# Patient Record
Sex: Female | Born: 1970 | Race: Black or African American | Hispanic: No | Marital: Single | Smoking: Never smoker
Health system: Southern US, Community
[De-identification: ages and names within clinical notes are randomized; demographics above are authoritative.]

## PROBLEM LIST (undated history)

## (undated) DIAGNOSIS — I10 Essential (primary) hypertension: Secondary | ICD-10-CM

## (undated) DIAGNOSIS — K611 Rectal abscess: Secondary | ICD-10-CM

## (undated) DIAGNOSIS — F32A Depression, unspecified: Secondary | ICD-10-CM

## (undated) DIAGNOSIS — D509 Iron deficiency anemia, unspecified: Secondary | ICD-10-CM

## (undated) DIAGNOSIS — N390 Urinary tract infection, site not specified: Secondary | ICD-10-CM

## (undated) HISTORY — DX: Rectal abscess: K61.1

## (undated) HISTORY — PX: INCISE AND DRAIN ABCESS: PRO64

## (undated) HISTORY — DX: Urinary tract infection, site not specified: N39.0

## (undated) HISTORY — DX: Depression, unspecified: F32.A

## (undated) HISTORY — DX: Essential (primary) hypertension: I10

## (undated) HISTORY — DX: Iron deficiency anemia, unspecified: D50.9

## (undated) HISTORY — PX: GASTRIC BYPASS: SHX52

---

## 1998-03-29 ENCOUNTER — Other Ambulatory Visit: Admission: RE | Admit: 1998-03-29 | Discharge: 1998-03-29 | Payer: Self-pay | Admitting: Obstetrics and Gynecology

## 1999-02-18 ENCOUNTER — Ambulatory Visit (HOSPITAL_COMMUNITY): Admission: RE | Admit: 1999-02-18 | Discharge: 1999-02-18 | Payer: Self-pay | Admitting: Gastroenterology

## 1999-02-18 ENCOUNTER — Encounter: Payer: Self-pay | Admitting: Gastroenterology

## 1999-08-11 ENCOUNTER — Encounter: Payer: Self-pay | Admitting: General Surgery

## 1999-08-12 ENCOUNTER — Ambulatory Visit (HOSPITAL_COMMUNITY): Admission: RE | Admit: 1999-08-12 | Discharge: 1999-08-13 | Payer: Self-pay | Admitting: General Surgery

## 1999-08-12 ENCOUNTER — Encounter: Payer: Self-pay | Admitting: General Surgery

## 2000-06-04 ENCOUNTER — Other Ambulatory Visit: Admission: RE | Admit: 2000-06-04 | Discharge: 2000-06-04 | Payer: Self-pay | Admitting: Internal Medicine

## 2000-06-04 ENCOUNTER — Encounter (INDEPENDENT_AMBULATORY_CARE_PROVIDER_SITE_OTHER): Payer: Self-pay

## 2000-08-20 ENCOUNTER — Other Ambulatory Visit: Admission: RE | Admit: 2000-08-20 | Discharge: 2000-08-20 | Payer: Self-pay | Admitting: Gynecology

## 2000-08-20 ENCOUNTER — Encounter (INDEPENDENT_AMBULATORY_CARE_PROVIDER_SITE_OTHER): Payer: Self-pay

## 2004-02-08 ENCOUNTER — Encounter: Admission: RE | Admit: 2004-02-08 | Discharge: 2004-02-08 | Payer: Self-pay | Admitting: Internal Medicine

## 2004-05-29 ENCOUNTER — Ambulatory Visit (HOSPITAL_BASED_OUTPATIENT_CLINIC_OR_DEPARTMENT_OTHER): Admission: RE | Admit: 2004-05-29 | Discharge: 2004-05-29 | Payer: Self-pay | Admitting: Internal Medicine

## 2004-06-01 ENCOUNTER — Ambulatory Visit (HOSPITAL_COMMUNITY): Admission: RE | Admit: 2004-06-01 | Discharge: 2004-06-01 | Payer: Self-pay | Admitting: Internal Medicine

## 2005-05-02 ENCOUNTER — Other Ambulatory Visit: Admission: RE | Admit: 2005-05-02 | Discharge: 2005-05-02 | Payer: Self-pay | Admitting: Obstetrics and Gynecology

## 2006-12-11 HISTORY — PX: CYSTECTOMY: SUR359

## 2007-01-03 ENCOUNTER — Encounter: Admission: RE | Admit: 2007-01-03 | Discharge: 2007-01-03 | Payer: Self-pay | Admitting: General Surgery

## 2007-02-06 ENCOUNTER — Other Ambulatory Visit: Admission: RE | Admit: 2007-02-06 | Discharge: 2007-02-06 | Payer: Self-pay | Admitting: Gynecology

## 2007-10-02 IMAGING — CT CT CHEST W/ CM
4 of 5 series · 13 of 46 positions shown, 19 images · IV contrast (READICAT/WATER & [ID] OMNI 300)
Comparison: Outside plain films.

CLINICAL DATA: Metallic density seen in the lower chest/upper abdomen on prior
outside plain films. Question foreign body within the patient for artifact
external to the patient.

CHEST CT WITH CONTRAST
TECHNIQUE: Multidetector CT imaging of the chest was performed following the
standard protocol during bolus administration of intravenous contrast.
Contrast:  125 cc Omnipaque 300
TECHNIQUE: Multidetector CT imaging of the abdomen was performed following the

[Series 3: chest/abd/pelvis · axial · 0.88mm/px · z∈[-385,-115]mm · 6 of 88 slices shown]
[im 6/88  soft-tissue]
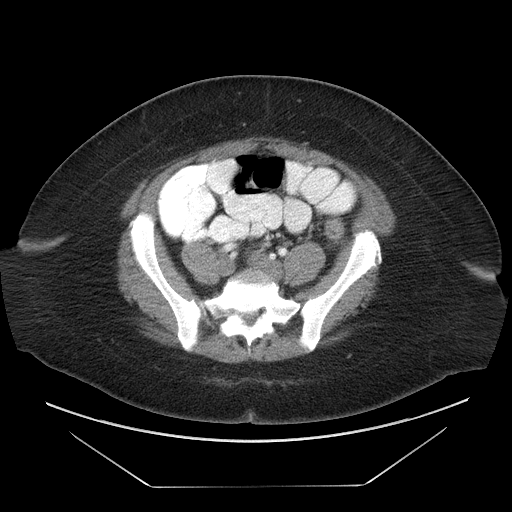
[im 17/88  soft-tissue]
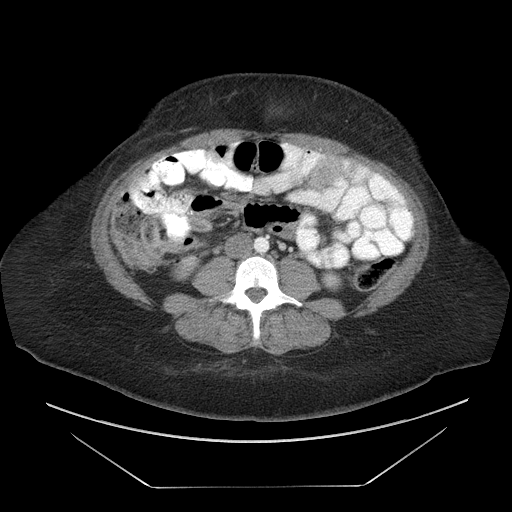
[im 28/88  soft-tissue]
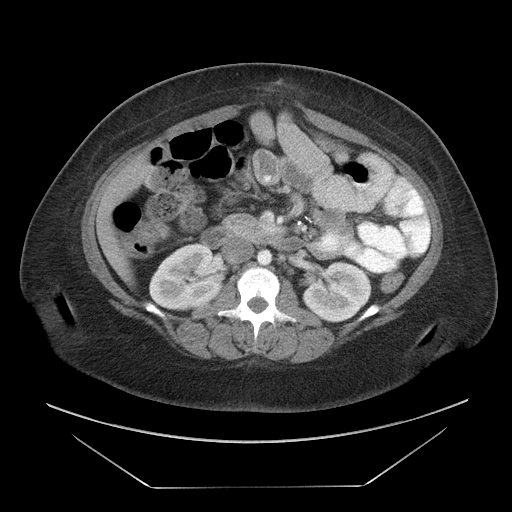
[im 39/88  soft-tissue]
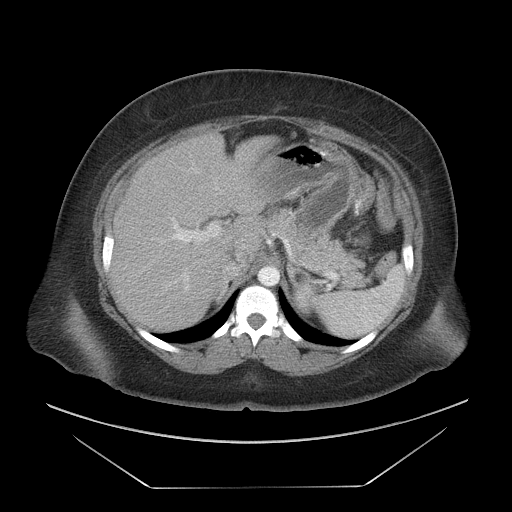
[im 49/88  soft-tissue]
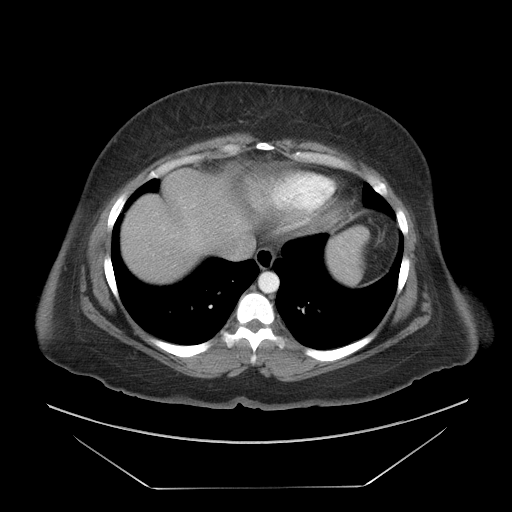
[im 60/88  soft-tissue]
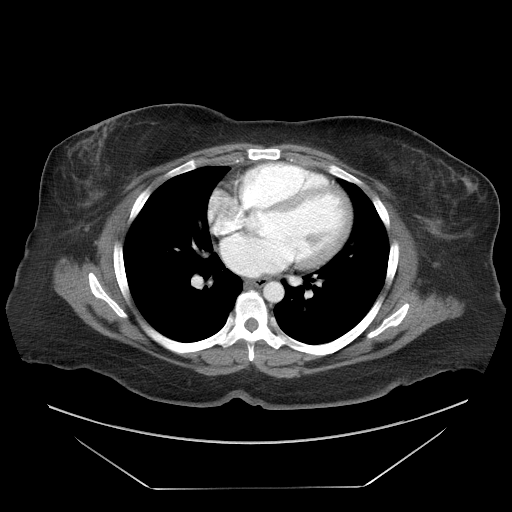

[Series 5: renal delay · axial · delayed · 0.86mm/px · z∈[-319,-249]mm · 3 of 30 slices shown, 7 images]
[im 8/30  soft-tissue]
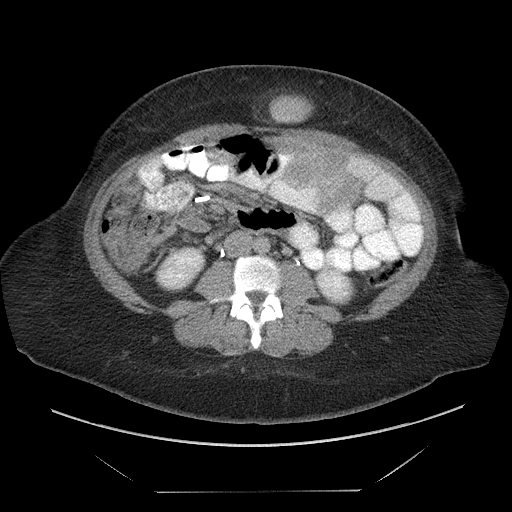
[im 8/30  lung]
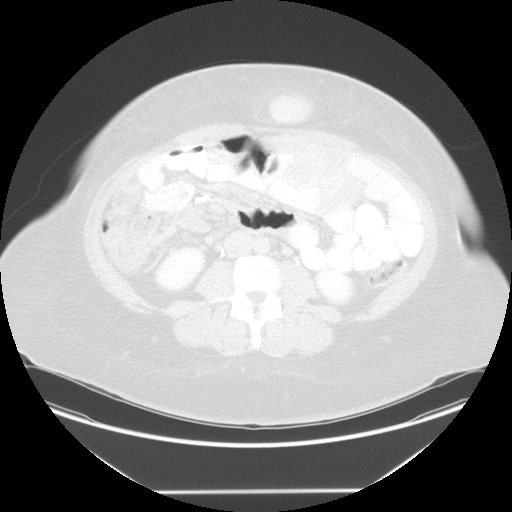
[im 8/30  bone]
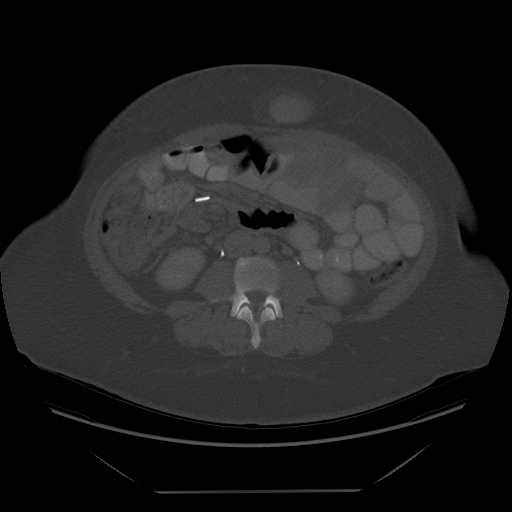
[im 15/30  soft-tissue]
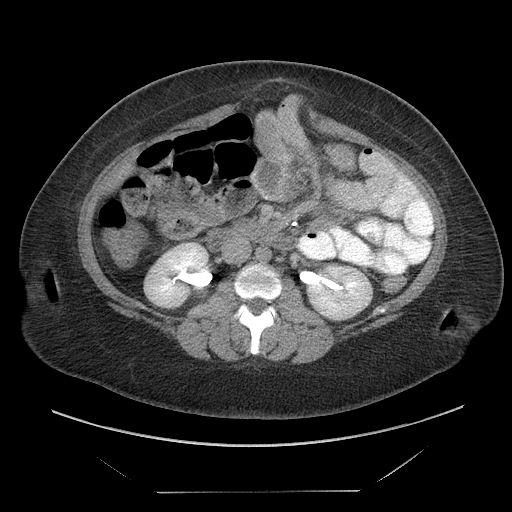
[im 15/30  lung]
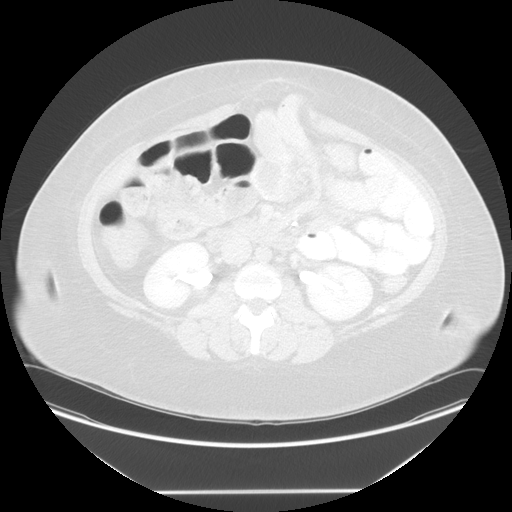
[im 22/30  soft-tissue]
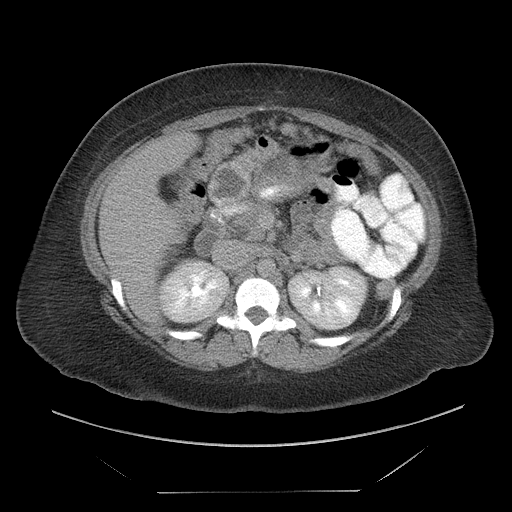
[im 22/30  lung]
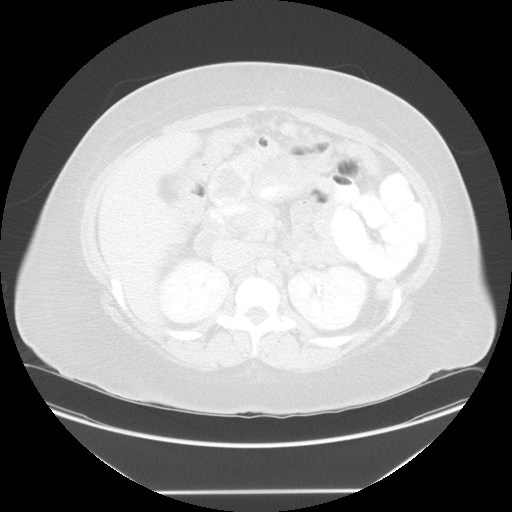

[Series 601: coronal body · coronal · 0.88mm/px · 1 of 134 slices shown, 2 images]
[im 45/134  soft-tissue]
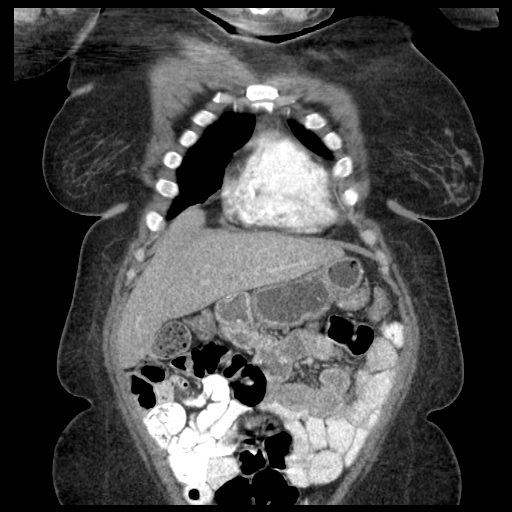
[im 45/134  bone]
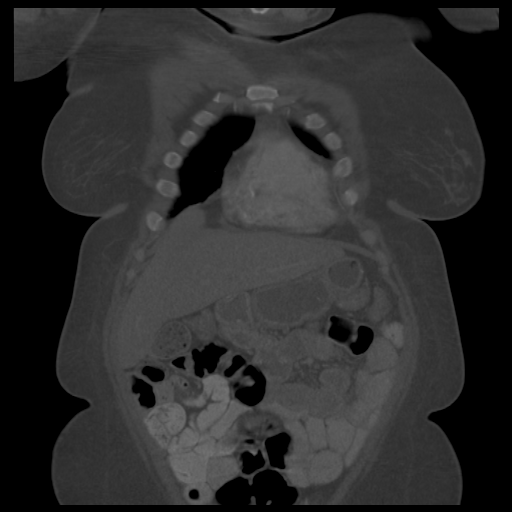

[Series 602: sagittal body · sagittal · 0.88mm/px · 3 of 181 slices shown, 4 images]
[im 61/181  soft-tissue]
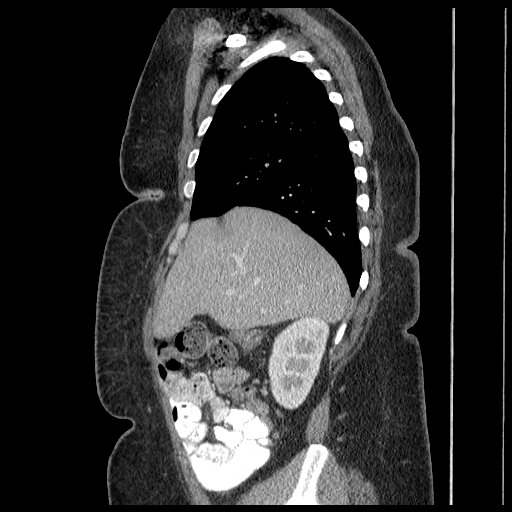
[im 81/181  soft-tissue]
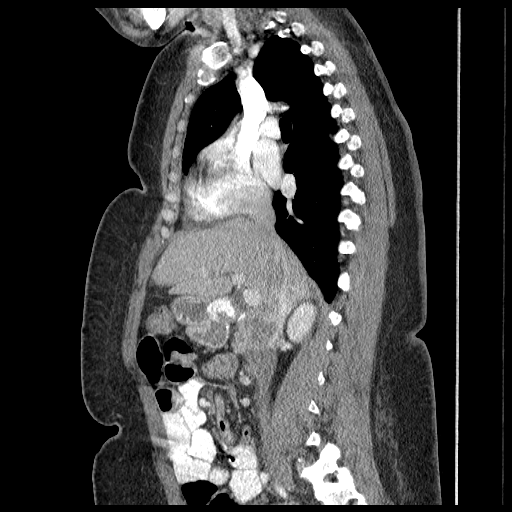
[im 81/181  bone]
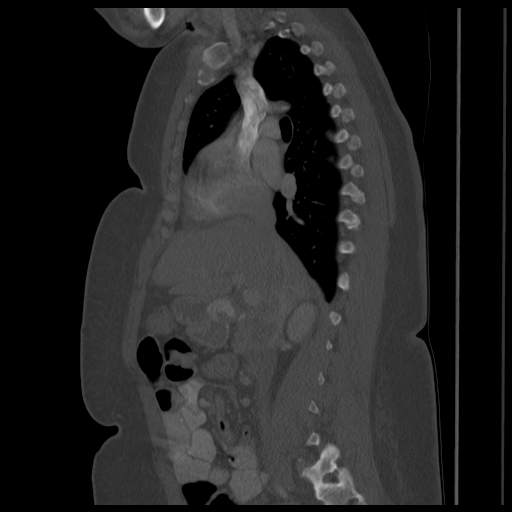
[im 101/181  soft-tissue]
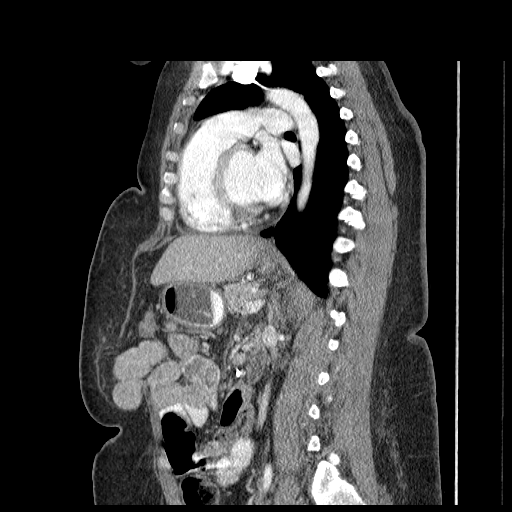

[13 of 46 positions shown; findings below may reference images not displayed]

FINDINGS: Lungs are clear. No pleural or pericardial effusion. No mediastinal,
hilar, or axillary adenopathy. Heart is normal size.

IMPRESSION

No acute findings in the chest.

ABDOMEN CT WITH CONTRAST
FINDINGS: Patient is status post cholecystectomy. There is a radiopaque linear
density noted anterior to the left lobe of the liver near the dome, which
appears to represent a metallic clip. Other linear metallic densities are noted
lower within the abdomen, in the mesentery. These may have been either placed in
these locations during prior surgical procedure or have migrated to these
locations. There is no surrounding inflammatory change or other complicating
feature noted with these presumed clips.

Liver, spleen, pancreas, adrenals, kidneys unremarkable. There is a ventral
hernia present which contains a knuckle of small bowel. No evidence of bowel
obstruction, free fluid, free air, or adenopathy.

IMPRESSION

The density seen on outside plain films appears to represent a metallic clip
anterior to the left lobe of the liver. There are other metallic clips lower in
the abdomen within the mesentery. Surgical clips are also seen from prior
cholecystectomy. No complicating features seen with these clips.

Ventral hernia containing small bowel, without obstruction.

## 2009-06-27 ENCOUNTER — Emergency Department (HOSPITAL_BASED_OUTPATIENT_CLINIC_OR_DEPARTMENT_OTHER): Admission: EM | Admit: 2009-06-27 | Discharge: 2009-06-27 | Payer: Self-pay | Admitting: Emergency Medicine

## 2009-11-29 LAB — CONVERTED CEMR LAB: Pap Smear: NORMAL

## 2010-09-20 ENCOUNTER — Ambulatory Visit: Payer: Self-pay | Admitting: Family Medicine

## 2010-09-20 DIAGNOSIS — T148XXA Other injury of unspecified body region, initial encounter: Secondary | ICD-10-CM | POA: Insufficient documentation

## 2010-09-20 DIAGNOSIS — Z9884 Bariatric surgery status: Secondary | ICD-10-CM

## 2010-09-21 ENCOUNTER — Telehealth (INDEPENDENT_AMBULATORY_CARE_PROVIDER_SITE_OTHER): Payer: Self-pay | Admitting: *Deleted

## 2010-09-21 DIAGNOSIS — E559 Vitamin D deficiency, unspecified: Secondary | ICD-10-CM | POA: Insufficient documentation

## 2010-09-21 LAB — CONVERTED CEMR LAB
ALT: 17 units/L (ref 0–35)
AST: 17 units/L (ref 0–37)
Albumin: 3.4 g/dL — ABNORMAL LOW (ref 3.5–5.2)
BUN: 11 mg/dL (ref 6–23)
Basophils Relative: 0.4 % (ref 0.0–3.0)
CO2: 25 meq/L (ref 19–32)
Calcium: 8.8 mg/dL (ref 8.4–10.5)
Chloride: 108 meq/L (ref 96–112)
Creatinine, Ser: 0.9 mg/dL (ref 0.4–1.2)
Eosinophils Relative: 0.9 % (ref 0.0–5.0)
Ferritin: 5.5 ng/mL — ABNORMAL LOW (ref 10.0–291.0)
Folate: 18.9 ng/mL
GFR calc non Af Amer: 95.52 mL/min (ref >60)
Glucose, Bld: 83 mg/dL (ref 70–99)
HDL: 48.1 mg/dL (ref >39.00)
Hemoglobin: 11.7 g/dL — ABNORMAL LOW (ref 12.0–15.0)
INR: 1.1 — ABNORMAL HIGH (ref 0.8–1.0)
MCHC: 33.1 g/dL (ref 30.0–36.0)
Potassium: 4.3 meq/L (ref 3.5–5.1)
Total Bilirubin: 0.4 mg/dL (ref 0.3–1.2)
Total Protein: 6.9 g/dL (ref 6.0–8.3)
Triglycerides: 49 mg/dL (ref 0.0–149.0)
Vit D, 25-Hydroxy: 10 ng/mL — ABNORMAL LOW (ref 30–89)
Vitamin B-12: 380 pg/mL (ref 211–911)
WBC: 4.9 10*3/uL (ref 4.5–10.5)
aPTT: 31.3 s — ABNORMAL HIGH (ref 21.7–28.8)

## 2011-01-01 ENCOUNTER — Encounter: Payer: Self-pay | Admitting: Internal Medicine

## 2011-01-12 NOTE — Letter (Signed)
Summary: Metabolic Syndrome Program Form/[employer]  Metabolic Syndrome Program Form/[employer]   Imported By: Lanelle Bal 09/28/2010 15:44:54  _____________________________________________________________________  External Attachment:    Type:   Image     Comment:   External Document

## 2011-01-12 NOTE — Progress Notes (Signed)
Summary: Lab result  Phone Note Outgoing Call   Call placed by: [facility] CMA,  September 21, 2010 10:40 AM Details for Reason: --Pt is anemic---but only slightly----try just taking MVI with iron two times a day.   B12 is normal but low normal----take sublingual b12 daily --very low vita D-----take rx for 50000u weekly #4  2 refills and otc vita D3  2000u daily----recheck 3 months Summary of Call: left message to call office................Marland KitchenFelecia Deloach CMA  September 21, 2010 10:03 AM  Discuss with patient will pick-up copy of labs and rx, placed up front for pt............Marland KitchenFelecia Deloach CMA  September 21, 2010 10:40 AM     New/Updated Medications: VITAMIN D (ERGOCALCIFEROL) 50000 UNIT CAPS (ERGOCALCIFEROL) Take 1 tab weekly Prescriptions: VITAMIN D (ERGOCALCIFEROL) 50000 UNIT CAPS (ERGOCALCIFEROL) Take 1 tab weekly  #4 x 2   Entered by:   Jeremy Johann CMA   Authorized by:   Loreen Freud DO   Signed by:   Jeremy Johann CMA on 09/21/2010   Method used:   Print then Give to Patient   RxID:   [id number]

## 2011-01-12 NOTE — Letter (Signed)
Summary: Cancer Screening/[employer] Personalized Risk Profile  Cancer Screening/[employer] Personalized Risk Profile   Imported By: Lanelle Bal 09/28/2010 15:59:23  _____________________________________________________________________  External Attachment:    Type:   Image     Comment:   External Document

## 2011-01-12 NOTE — Assessment & Plan Note (Signed)
Summary: CPX--NO PAP///SPH   Vital Signs:  Patient profile:   40 year old female Height:      60.5 inches Weight:      226 pounds BMI:     43.57 Pulse rate:   86 / minute BP sitting:   132 / 78  (left arm)  Vitals Entered By: Doristine Devoid CMA (September 20, 2010 12:57 PM) CC: NEW EST- bruising xmonths and cramps in hands hx of vit d defiency   History of Present Illness: Pt here for cpe.  Pt has gyn.  Pt c/o hands cramping and bruising.  pt is a gastric bypass pt at [facility] but her surgeon is no longer practicing.      Preventive Screening-Counseling & Management  Alcohol-Tobacco     Alcohol drinks/day: 0     Smoking Status: never  Caffeine-Diet-Exercise     Caffeine use/day: 0     Diet Comments: gastric bypass pt     Does Patient Exercise: yes     Type of exercise: gym      Exercise (avg: min/session): 30-60     Times/week: <3     Exercise Counseling: to improve exercise regimen  Hep-HIV-STD-Contraception     Dental Visit-last 6 months no     Dental Care Counseling: to seek dental care; no dental care within six months     SBE monthly: yes     SBE Education/Counseling: not indicated; SBE done regularly      Sexual History:  single.        Drug Use:  no.    Current Medications (verified): 1)  None  Allergies (verified): No Known Drug Allergies  Past History:  Past Surgical History: Gastric bypass-2005 Cholecystectomy  Family History: Reviewed history and no changes required. Family History Diabetes 1st degree relative Family History Hypertension Family History of Stroke F 1st degree relative <60  Social History: Reviewed history and no changes required. Occupation:  Community education officer Single Never Smoked Alcohol use-no Drug use-no Does Patient Exercise:  yes Smoking Status:  never Caffeine use/day:  0 Dental Care w/in 6 mos.:  no Sexual History:  single Occupation:  employed Drug Use:  no  Review of Systems      See HPI General:  Denies chills,  fatigue, fever, loss of appetite, malaise, sleep disorder, sweats, weakness, and weight loss. Eyes:  Denies blurring, discharge, double vision, eye irritation, eye pain, halos, itching, light sensitivity, red eye, vision loss-1 eye, and vision loss-both eyes; optho q2y. ENT:  Denies decreased hearing, difficulty swallowing, ear discharge, earache, hoarseness, nasal congestion, nosebleeds, postnasal drainage, ringing in ears, sinus pressure, and sore throat. CV:  Denies bluish discoloration of lips or nails, chest pain or discomfort, difficulty breathing at night, difficulty breathing while lying down, fainting, fatigue, leg cramps with exertion, lightheadness, near fainting, palpitations, shortness of breath with exertion, swelling of feet, swelling of hands, and weight gain. Resp:  Denies chest discomfort, chest pain with inspiration, cough, coughing up blood, excessive snoring, hypersomnolence, morning headaches, pleuritic, shortness of breath, sputum productive, and wheezing. GI:  Denies abdominal pain, bloody stools, change in bowel habits, constipation, dark tarry stools, diarrhea, excessive appetite, gas, hemorrhoids, indigestion, loss of appetite, nausea, vomiting, vomiting blood, and yellowish skin color. GU:  Denies abnormal vaginal bleeding, decreased libido, discharge, dysuria, genital sores, hematuria, incontinence, nocturia, urinary frequency, and urinary hesitancy. MS:  Denies joint pain, joint redness, joint swelling, loss of strength, low back pain, mid back pain, muscle aches, muscle , cramps, muscle weakness, stiffness,  and thoracic pain. Derm:  Denies changes in color of skin, changes in nail beds, dryness, excessive perspiration, flushing, hair loss, insect bite(s), itching, lesion(s), poor wound healing, and rash; brusing. Neuro:  Denies brief paralysis, difficulty with concentration, disturbances in coordination, falling down, headaches, inability to speak, memory loss, numbness, poor  balance, seizures, sensation of room spinning, tingling, tremors, visual disturbances, and weakness. Psych:  Denies alternate hallucination ( auditory/visual), anxiety, depression, easily angered, easily tearful, irritability, mental problems, panic attacks, sense of great danger, suicidal thoughts/plans, thoughts of violence, unusual visions or sounds, and thoughts /plans of harming others. Endo:  Denies cold intolerance, excessive hunger, excessive thirst, excessive urination, heat intolerance, polyuria, and weight change. Heme:  Complains of abnormal bruising; denies bleeding, enlarge lymph nodes, fevers, pallor, and skin discoloration. Allergy:  Denies hives or rash, itching eyes, persistent infections, seasonal allergies, and sneezing.  Physical Exam  General:  Well-developed,well-nourished,in no acute distress; alert,appropriate and cooperative throughout examinationoverweight-appearing.   Head:  Normocephalic and atraumatic without obvious abnormalities. No apparent alopecia or balding. Eyes:  pupils equal, pupils round, pupils reactive to light, and no injection.  conjunctiva pale Ears:  External ear exam shows no significant lesions or deformities.  Otoscopic examination reveals clear canals, tympanic membranes are intact bilaterally without bulging, retraction, inflammation or discharge. Hearing is grossly normal bilaterally. Nose:  External nasal examination shows no deformity or inflammation. Nasal mucosa are pink and moist without lesions or exudates. Mouth:  Oral mucosa and oropharynx without lesions or exudates.  Teeth in good repair. Neck:  No deformities, masses, or tenderness noted. Breasts:  gyn Lungs:  Normal respiratory effort, chest expands symmetrically. Lungs are clear to auscultation, no crackles or wheezes. Heart:  normal rate and no murmur.   Abdomen:  Bowel sounds positive,abdomen soft and non-tender without masses, organomegaly or hernias noted.   obese + errythema and  chaffed skin between folds Rectal:  gyn Genitalia:  gyn Msk:  normal ROM, no joint tenderness, no joint swelling, no joint warmth, no redness over joints, no joint deformities, no joint instability, and no crepitation.   Pulses:  R and L carotid,radial,femoral,dorsalis pedis and posterior tibial pulses are full and equal bilaterally Extremities:  trace left pedal edema and trace right pedal edema.   Neurologic:  No cranial nerve deficits noted. Station and gait are normal. Plantar reflexes are down-going bilaterally. DTRs are symmetrical throughout. Sensory, motor and coordinative functions appear intact. Skin:  1-2 minor bruises seen today no lesions Cervical Nodes:  No lymphadenopathy noted Psych:  Cognition and judgment appear intact. Alert and cooperative with normal attention span and concentration. No apparent delusions, illusions, hallucinations   Impression & Recommendations:  Problem # 1:  PREVENTIVE HEALTH CARE (ICD-V70.0) ghm utd  tetanus within the last 10 years per pt  Orders: Venipuncture (56433) TLB-B12 + Folate Pnl (29518_84166-A63/KZS) TLB-Lipid Panel (80061-LIPID) TLB-IBC Pnl (Iron/FE;Transferrin) (83550-IBC) TLB-BMP (Basic Metabolic Panel-BMET) (80048-METABOL) TLB-CBC Platelet - w/Differential (85025-CBCD) TLB-Hepatic/Liver Function Pnl (80076-HEPATIC) TLB-TSH (Thyroid Stimulating Hormone) (84443-TSH) TLB-Ferritin (82728-FER) TLB-PTT (85730-PTTL) TLB-PT (Protime) (85610-PTP) T-Vitamin D (25-Hydroxy) (01093-23557)  Problem # 2:  BARIATRIC SURGERY STATUS (ICD-V45.86)  Orders: Venipuncture (32202) TLB-B12 + Folate Pnl (54270_62376-E83/TDV) TLB-Lipid Panel (80061-LIPID) TLB-IBC Pnl (Iron/FE;Transferrin) (83550-IBC) TLB-BMP (Basic Metabolic Panel-BMET) (80048-METABOL) TLB-CBC Platelet - w/Differential (85025-CBCD) TLB-Hepatic/Liver Function Pnl (80076-HEPATIC) TLB-TSH (Thyroid Stimulating Hormone) (84443-TSH) TLB-Ferritin (82728-FER) TLB-PTT  (85730-PTTL) TLB-PT (Protime) (85610-PTP) T-Vitamin D (25-Hydroxy) (76160-73710) Specimen Handling (62694)  Problem # 3:  BRUISE (ICD-924.9)  Orders: TLB-Lipid Panel (80061-LIPID) TLB-IBC Pnl (Iron/FE;Transferrin) (83550-IBC) TLB-BMP (Basic  Metabolic Panel-BMET) (80048-METABOL) TLB-CBC Platelet - w/Differential (85025-CBCD) TLB-Hepatic/Liver Function Pnl (80076-HEPATIC) TLB-TSH (Thyroid Stimulating Hormone) (84443-TSH) TLB-Ferritin (82728-FER) TLB-PTT (85730-PTTL) TLB-PT (Protime) (85610-PTP) T-Vitamin D (25-Hydroxy) (04540-98119) Specimen Handling (14782)   Preventive Care Screening  Pap Smear:    Date:  11/29/2009    Results:  normal   Flu Vaccine Next Due:  Refused PAP Result Date:  11/24/2009 PAP Result:  abnormal--HPV PAP Next Due:  1 yr   Appended Document: CPX--NO PAP///SPH  Laboratory Results   Urine Tests   Date/Time Reported: September 20, 2010 2:24 PM   Routine Urinalysis   Color: yellow Appearance: Clear Glucose: negative   (Normal Range: Negative) Bilirubin: negative   (Normal Range: Negative) Ketone: negative   (Normal Range: Negative) Spec. Gravity: 1.025   (Normal Range: 1.003-1.035) Blood: negative   (Normal Range: Negative) pH: 6.0   (Normal Range: 5.0-8.0) Protein: negative   (Normal Range: Negative) Urobilinogen: negative   (Normal Range: 0-1) Nitrite: negative   (Normal Range: Negative) Leukocyte Esterace: negative   (Normal Range: Negative)    Comments: Floydene Flock  September 20, 2010 2:24 PM

## 2011-05-09 ENCOUNTER — Other Ambulatory Visit: Payer: Self-pay | Admitting: Obstetrics and Gynecology

## 2011-12-14 ENCOUNTER — Ambulatory Visit (INDEPENDENT_AMBULATORY_CARE_PROVIDER_SITE_OTHER): Payer: Managed Care, Other (non HMO) | Admitting: General Surgery

## 2011-12-14 ENCOUNTER — Encounter (INDEPENDENT_AMBULATORY_CARE_PROVIDER_SITE_OTHER): Payer: Self-pay | Admitting: General Surgery

## 2011-12-14 ENCOUNTER — Other Ambulatory Visit (INDEPENDENT_AMBULATORY_CARE_PROVIDER_SITE_OTHER): Payer: Self-pay | Admitting: General Surgery

## 2011-12-14 VITALS — BP 122/88 | HR 72 | Temp 98.8°F | Resp 18 | Ht 60.0 in | Wt 239.2 lb

## 2011-12-14 DIAGNOSIS — K612 Anorectal abscess: Secondary | ICD-10-CM

## 2011-12-14 DIAGNOSIS — K611 Rectal abscess: Secondary | ICD-10-CM

## 2011-12-14 MED ORDER — AMOXICILLIN-POT CLAVULANATE 875-125 MG PO TABS: 1.0000 | ORAL_TABLET | Freq: Two times a day (BID) | ORAL | Status: AC

## 2011-12-14 NOTE — Progress Notes (Signed)
Addended by: Latricia Heft on: 12/14/2011 04:25 PM   Modules accepted: Orders

## 2011-12-14 NOTE — Progress Notes (Signed)
Subjective:     Patient ID: Peggy [name], female   DOB: 01/27/1971, 41 y.o.   MRN: [id number]  HPI Patient complains of 48 hour history of perirectal pain. She's had a perirectal abscess in the past. She was told this was due to a fistula. It resolved after incision and drainage. It was several years ago. This feels similar to the last time although it has not gotten as bad.  Review of Systems     Objective:   Physical Exam  Cardiovascular: Normal rate and regular rhythm.   Pulmonary/Chest: Effort normal. No respiratory distress. She has no wheezes.  Perianal region reveals a 1.5 cm fluctuant area to the left side of the anal opening consistent with a perirectal abscess. It is very tender. Procedure note: Area was prepped in sterile fashion 1% lidocaine with epinephrine was injected. An incision was made with prompt evacuation of pus. This was sent for culture. The wound was irrigated. It was then packed with iodoform gauze. A gauze dressing was applied. There was good hemostasis at the completion.     Assessment:     Perirectal abscess    Plan:     This was incised and drained as described above. I will start her on Augmentin. I will have her check in urgent clinic tomorrow for possible packing removal.

## 2011-12-14 NOTE — Patient Instructions (Signed)
Cover area with gauze and change as needed

## 2011-12-15 ENCOUNTER — Telehealth (INDEPENDENT_AMBULATORY_CARE_PROVIDER_SITE_OTHER): Payer: Self-pay | Admitting: General Surgery

## 2011-12-15 ENCOUNTER — Ambulatory Visit (INDEPENDENT_AMBULATORY_CARE_PROVIDER_SITE_OTHER): Payer: Managed Care, Other (non HMO) | Admitting: Surgery

## 2011-12-15 ENCOUNTER — Encounter (INDEPENDENT_AMBULATORY_CARE_PROVIDER_SITE_OTHER): Payer: Self-pay | Admitting: Surgery

## 2011-12-15 VITALS — BP 130/78 | HR 68 | Temp 97.1°F | Resp 18 | Ht 60.0 in | Wt 239.0 lb

## 2011-12-15 DIAGNOSIS — K612 Anorectal abscess: Secondary | ICD-10-CM

## 2011-12-15 DIAGNOSIS — K611 Rectal abscess: Secondary | ICD-10-CM | POA: Insufficient documentation

## 2011-12-15 NOTE — Patient Instructions (Signed)
Frequent sitz baths two or three times a day

## 2011-12-15 NOTE — Progress Notes (Signed)
The packing has been removed.  The swelling has decreased, but there is still some tenderness around the opening.  I encouraged her to use frequent sitz baths over the weekend and to complete her course of antibiotics.  Follow-up 2 weeks with Dr. Janee Morn.  Wilmon Arms. Corliss Skains, MD, [facility]  12/15/2011 3:48 PM

## 2011-12-17 LAB — WOUND CULTURE
Gram Stain: NONE SEEN
Organism ID, Bacteria: NORMAL

## 2012-06-16 ENCOUNTER — Encounter (HOSPITAL_BASED_OUTPATIENT_CLINIC_OR_DEPARTMENT_OTHER): Payer: Self-pay

## 2012-06-16 ENCOUNTER — Emergency Department (HOSPITAL_BASED_OUTPATIENT_CLINIC_OR_DEPARTMENT_OTHER)
Admission: EM | Admit: 2012-06-16 | Discharge: 2012-06-16 | Disposition: A | Discharge status: Home / Self Care | Payer: Managed Care, Other (non HMO) | Attending: Emergency Medicine | Admitting: Emergency Medicine

## 2012-06-16 DIAGNOSIS — K612 Anorectal abscess: Secondary | ICD-10-CM | POA: Insufficient documentation

## 2012-06-16 DIAGNOSIS — K610 Anal abscess: Secondary | ICD-10-CM

## 2012-06-16 DIAGNOSIS — Z9884 Bariatric surgery status: Secondary | ICD-10-CM | POA: Insufficient documentation

## 2012-06-16 MED ORDER — ONDANSETRON 8 MG PO TBDP
8.0000 mg | ORAL_TABLET | Freq: Once | ORAL | Status: AC
  Administered 2012-06-16: 8 mg via ORAL

## 2012-06-16 MED ORDER — HYDROCODONE-ACETAMINOPHEN 5-325 MG PO TABS: 1.0000 | ORAL_TABLET | Freq: Four times a day (QID) | ORAL | Status: DC | PRN

## 2012-06-16 MED ORDER — LIDOCAINE HCL 4 % EX SOLN
Freq: Once | CUTANEOUS | Status: AC
  Administered 2012-06-16: 03:00:00 via TOPICAL
  Filled 2012-06-16: qty 50

## 2012-06-16 MED ORDER — ONDANSETRON 8 MG PO TBDP
ORAL_TABLET | ORAL | Status: AC
  Filled 2012-06-16: qty 1

## 2012-06-16 MED ORDER — HYDROMORPHONE HCL PF 1 MG/ML IJ SOLN
2.0000 mg | Freq: Once | INTRAMUSCULAR | Status: AC
  Administered 2012-06-16: 2 mg via INTRAVENOUS
  Filled 2012-06-16: qty 2

## 2012-06-16 NOTE — ED Notes (Signed)
Patient here with reported rectal abcess that developed on Friday. Denies drainage but reports increasing pain. Patient states that she had same several months ago that was drained at [facility] by [employer]. Taking Sitz baths w/o relief.

## 2012-06-16 NOTE — ED Provider Notes (Signed)
History     CSN: [id number]  Arrival date & time 06/16/12  0222   First MD Initiated Contact with Patient 06/16/12 0245      Chief Complaint  Patient presents with  . Rectal abscess     (Consider location/radiation/quality/duration/timing/severity/associated sxs/prior treatment) HPI This is a 41 year old black female with past medical history significant for perirectal abscess. She complains of right perirectal pain and swelling for the past 2 days. The pain is severe and worse with movement or sitting and with defecation. She denies fever but has had chills. He states the symptoms are consistent with prior abscesses.  Past Medical History  Diagnosis Date  . Perirectal abscess     Past Surgical History  Procedure Date  . Gastic bypass 08/2004  . Cystectomy 2008    No family history on file.  History  Substance Use Topics  . Smoking status: Never Smoker   . Smokeless tobacco: Never Used  . Alcohol Use: No    OB History    Grav Para Term Preterm Abortions TAB SAB Ect Mult Living                  Review of Systems  All other systems reviewed and are negative.    Allergies  Review of patient's allergies indicates no known allergies.  Home Medications  No current outpatient prescriptions on file.  BP 135/80  Pulse 108  Temp 98.8 F (37.1 C)  Resp 18  SpO2 100%  Physical Exam General: Well-developed, well-nourished female in no acute distress; appearance consistent with age of record; uncomfortable appearing HENT: normocephalic, atraumatic Eyes: pupils equal round and reactive to light; extraocular muscles intact Neck: supple Heart: regular rate and rhythm; tachycardic Lungs: clear to auscultation bilaterally Abdomen: soft; nondistended Extremities: No deformity; full range of motion Rectal: Pointing right perirectal abscess Neurologic: Awake, alert and oriented; motor function intact in all extremities and symmetric; no facial droop Skin: Warm and  dry Psychiatric: Anxious    ED Course  Procedures (including critical care time)  INCISION AND DRAINAGE Performed by: Paula Libra L Consent: Verbal consent obtained. Risks and benefits: risks, benefits and alternatives were discussed Type: abscess  Body area: Right perianal region  Anesthesia: local infiltration  Local anesthetic: lidocaine 2% with epinephrine  Anesthetic total: 2 ml  Complexity: complex Blunt dissection to break up loculations, wound found to be shallow  Drainage: purulent  Drainage amount: Moderate   Packing material: 1/2 in iodoform gauze  Patient tolerance: Patient tolerated the procedure well with no immediate complications.      MDM  We will have patient follow up with [facility] and she's had a complicated perirectal history.        Hanley Seamen, MD 06/16/12 [phone]

## 2012-06-16 NOTE — ED Notes (Signed)
Patient having some nausea post procedure. Discharge instructions reviewed and sister remains at bedside

## 2012-06-16 NOTE — ED Notes (Signed)
Drainage completed by Dr. Read Drivers and packing placed. Patient reports that her nausea is improving. Sister remains at bedside.

## 2012-06-17 ENCOUNTER — Encounter (INDEPENDENT_AMBULATORY_CARE_PROVIDER_SITE_OTHER): Payer: Self-pay | Admitting: General Surgery

## 2012-06-17 ENCOUNTER — Ambulatory Visit (INDEPENDENT_AMBULATORY_CARE_PROVIDER_SITE_OTHER): Payer: Managed Care, Other (non HMO) | Admitting: General Surgery

## 2012-06-17 VITALS — BP 128/70 | HR 72 | Temp 97.4°F | Resp 16 | Ht 60.0 in | Wt 233.4 lb

## 2012-06-17 DIAGNOSIS — K611 Rectal abscess: Secondary | ICD-10-CM

## 2012-06-17 DIAGNOSIS — K612 Anorectal abscess: Secondary | ICD-10-CM

## 2012-06-17 MED ORDER — OXYCODONE-ACETAMINOPHEN 5-325 MG PO TABS: 1.0000 | ORAL_TABLET | ORAL | Status: DC | PRN

## 2012-06-17 NOTE — Progress Notes (Signed)
Subjective:     Patient ID: Peggy [name], female   DOB: 03/26/1971, 41 y.o.   MRN: [id number]  HPI 73 yof with history of a perirectal abscess in recent past that was treated by Dr. Janee Morn.  On Friday she began noting a mass in the perianal region that is in the same spot as before she thinks. Prior to this she had no complaints.  This area got bigger and she presented to the er on Sunday am. This was drained in the er and it feels somewhat better but it is still painful with minimal drainage. She is concerned it was not completely drained and comes in today. She denies fevers.    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  I think this needs to be opened further.  I anesthetized this area and made a cruciate incision over this area widely opening this cavity.  I then packed this. She will remove in 48 hours and then start sitz baths. She will follow up with Dr. Janee Morn in a couple weeks.

## 2012-06-26 ENCOUNTER — Other Ambulatory Visit (INDEPENDENT_AMBULATORY_CARE_PROVIDER_SITE_OTHER): Payer: Self-pay | Admitting: General Surgery

## 2012-06-26 ENCOUNTER — Ambulatory Visit (INDEPENDENT_AMBULATORY_CARE_PROVIDER_SITE_OTHER): Payer: Managed Care, Other (non HMO) | Admitting: General Surgery

## 2012-06-26 ENCOUNTER — Encounter (INDEPENDENT_AMBULATORY_CARE_PROVIDER_SITE_OTHER): Payer: Self-pay | Admitting: General Surgery

## 2012-06-26 VITALS — BP 136/88 | HR 80 | Temp 98.4°F | Resp 14 | Ht 60.0 in | Wt 238.6 lb

## 2012-06-26 DIAGNOSIS — K610 Anal abscess: Secondary | ICD-10-CM | POA: Insufficient documentation

## 2012-06-26 DIAGNOSIS — K612 Anorectal abscess: Secondary | ICD-10-CM

## 2012-06-26 DIAGNOSIS — K529 Noninfective gastroenteritis and colitis, unspecified: Secondary | ICD-10-CM

## 2012-06-26 NOTE — Progress Notes (Signed)
Subjective:     Patient ID: Peggy [name], female   DOB: 05-21-1971, 42 y.o.   MRN: [id number]  HPI Patient has a history perirectal abscesses. These have occurred twice this year. She had a history of them in the past as well that was several years ago. After her gastric bypass, she became dehydrated with a colitis and was hospitalized. She is improving at this time. The most recent perianal abscess is healing. She is doing sitz baths. Her bowel movements have normalized.  Review of Systems     Objective:   Physical Exam  Neck: Normal range of motion. No tracheal deviation present.  Cardiovascular: Normal rate and regular rhythm.   Pulmonary/Chest: Effort normal. No respiratory distress. She has no wheezes. She has no rales.  Abdominal: Soft. She exhibits no distension. There is no tenderness. There is no rebound and no guarding.       Perianal exam reveals healing site of perianal abscess and left posterior lateral area. There is no erythema. There is no evidence of acute infection. I cannot find a fistulous opening. There is no drainage.  Musculoskeletal: Normal range of motion.       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 No surgery required. The patient needs a gastroenterology evaluation. I have arranged an appointment with her to see Dr. Loreta Ave in a couple weeks.

## 2012-08-14 ENCOUNTER — Encounter (INDEPENDENT_AMBULATORY_CARE_PROVIDER_SITE_OTHER): Payer: Managed Care, Other (non HMO) | Admitting: General Surgery

## 2012-09-06 LAB — HM COLONOSCOPY

## 2012-09-11 ENCOUNTER — Encounter (INDEPENDENT_AMBULATORY_CARE_PROVIDER_SITE_OTHER): Payer: Managed Care, Other (non HMO) | Admitting: General Surgery

## 2012-11-14 ENCOUNTER — Telehealth: Payer: Self-pay | Admitting: Oncology

## 2012-11-14 NOTE — Telephone Encounter (Signed)
S/W pt in re NP appt 12/12 @ 1 w/Dr. Welton Flakes.  Referring Dr. Loreta Ave Dx-IDA Welcome packet mailed

## 2012-11-14 NOTE — Telephone Encounter (Signed)
C/D 11/14/12 for appt.11/21/12 °

## 2012-11-20 ENCOUNTER — Encounter: Payer: Self-pay | Admitting: Medical Oncology

## 2012-11-20 DIAGNOSIS — E559 Vitamin D deficiency, unspecified: Secondary | ICD-10-CM

## 2012-11-20 DIAGNOSIS — K529 Noninfective gastroenteritis and colitis, unspecified: Secondary | ICD-10-CM

## 2012-11-20 DIAGNOSIS — K611 Rectal abscess: Secondary | ICD-10-CM

## 2012-11-20 DIAGNOSIS — K610 Anal abscess: Secondary | ICD-10-CM

## 2012-11-20 DIAGNOSIS — T148XXA Other injury of unspecified body region, initial encounter: Secondary | ICD-10-CM

## 2012-11-20 DIAGNOSIS — Z9884 Bariatric surgery status: Secondary | ICD-10-CM

## 2012-11-21 ENCOUNTER — Ambulatory Visit (HOSPITAL_BASED_OUTPATIENT_CLINIC_OR_DEPARTMENT_OTHER): Payer: Managed Care, Other (non HMO)

## 2012-11-21 ENCOUNTER — Other Ambulatory Visit (HOSPITAL_BASED_OUTPATIENT_CLINIC_OR_DEPARTMENT_OTHER): Payer: Managed Care, Other (non HMO) | Admitting: Lab

## 2012-11-21 ENCOUNTER — Telehealth: Payer: Self-pay | Admitting: *Deleted

## 2012-11-21 ENCOUNTER — Other Ambulatory Visit: Payer: Self-pay | Admitting: Adult Health

## 2012-11-21 ENCOUNTER — Encounter: Payer: Self-pay | Admitting: Oncology

## 2012-11-21 ENCOUNTER — Ambulatory Visit: Payer: Managed Care, Other (non HMO)

## 2012-11-21 ENCOUNTER — Ambulatory Visit (HOSPITAL_BASED_OUTPATIENT_CLINIC_OR_DEPARTMENT_OTHER): Payer: Managed Care, Other (non HMO) | Admitting: Oncology

## 2012-11-21 VITALS — BP 135/93 | HR 78 | Temp 98.4°F | Resp 20 | Ht 60.0 in | Wt 246.4 lb

## 2012-11-21 DIAGNOSIS — T148XXA Other injury of unspecified body region, initial encounter: Secondary | ICD-10-CM

## 2012-11-21 DIAGNOSIS — E559 Vitamin D deficiency, unspecified: Secondary | ICD-10-CM

## 2012-11-21 DIAGNOSIS — K611 Rectal abscess: Secondary | ICD-10-CM

## 2012-11-21 DIAGNOSIS — K610 Anal abscess: Secondary | ICD-10-CM

## 2012-11-21 DIAGNOSIS — D649 Anemia, unspecified: Secondary | ICD-10-CM

## 2012-11-21 DIAGNOSIS — D509 Iron deficiency anemia, unspecified: Secondary | ICD-10-CM

## 2012-11-21 DIAGNOSIS — E611 Iron deficiency: Secondary | ICD-10-CM

## 2012-11-21 DIAGNOSIS — Z9884 Bariatric surgery status: Secondary | ICD-10-CM

## 2012-11-21 DIAGNOSIS — K529 Noninfective gastroenteritis and colitis, unspecified: Secondary | ICD-10-CM

## 2012-11-21 HISTORY — DX: Iron deficiency anemia, unspecified: D50.9

## 2012-11-21 LAB — VITAMIN B12: Vitamin B-12: 595 pg/mL (ref 211–911)

## 2012-11-21 LAB — COMPREHENSIVE METABOLIC PANEL (CC13)
Albumin: 3.4 g/dL — ABNORMAL LOW (ref 3.5–5.0)
BUN: 10 mg/dL (ref 7.0–26.0)
CO2: 26 mEq/L (ref 22–29)
Creatinine: 0.9 mg/dL (ref 0.6–1.1)
Total Bilirubin: 0.26 mg/dL (ref 0.20–1.20)

## 2012-11-21 LAB — CBC WITH DIFFERENTIAL/PLATELET
BASO%: 0.6 % (ref 0.0–2.0)
MCH: 27.5 pg (ref 25.1–34.0)
MCHC: 32.9 g/dL (ref 31.5–36.0)
Platelets: 322 10*3/uL (ref 145–400)
RBC: 4.6 10*6/uL (ref 3.70–5.45)
WBC: 4.6 10*3/uL (ref 3.9–10.3)
lymph#: 1.9 10*3/uL (ref 0.9–3.3)

## 2012-11-21 LAB — IRON AND TIBC
%SAT: 12 % — ABNORMAL LOW (ref 20–55)
Iron: 47 ug/dL (ref 42–145)
UIBC: 333 ug/dL (ref 125–400)

## 2012-11-21 MED ORDER — SODIUM CHLORIDE 0.9 % IV SOLN
1020.0000 mg | Freq: Once | INTRAVENOUS | Status: DC
  Administered 2012-11-21: 1020 mg via INTRAVENOUS
  Filled 2012-11-21: qty 34

## 2012-11-21 MED ORDER — SODIUM CHLORIDE 0.9 % IV SOLN
INTRAVENOUS | Status: DC
  Administered 2012-11-21: 15:00:00 via INTRAVENOUS

## 2012-11-21 NOTE — Progress Notes (Signed)
[facility]  [phone] Fax:([phone]     INITIAL HEMATOLOGY CONSULTATION    Referral MD:   Dr. Starleen Arms  Reason for Referral: 41 year old female with iron deficiency anemia  HPI: Patient is a very pleasant 41 year old African American female who is seen today for her evaluation of iron deficiency anemia. She has been seen by Dr. Loreta Ave in the past. Most recently she was seen for changes in bowel habits since her gastric bypass surgery in 2005. Patient has had history of hospitalization for infectious colitis and severe dehydration.  She had noticed loose and frequent stools with certain foods. Because of this a full GI workup was performed including obtaining a CBC. Her last or significant for a mild anemia with a hemoglobin of 11.2 hematocrit 35.3 white count and platelets were normal. Iron studies performed revealed her to be iron deficient. Although I do not have her iron studies currently available to me. Patient does complain of fatigue. She has history of eating starch and ice. She denies having any nausea. She does have frequent bowel problems including frequent stools. She has not noticed any bright red blood per rectum. She denies shortness of breath chest pains or palpitations. She has been prescribed iron in the past but she is not able to tolerate it and does does not use it as often.    Past Medical History  Diagnosis Date  . Perirectal abscess   . Iron deficiency anemia, unspecified 11/21/2012  :    Past Surgical History  Procedure Date  . Cystectomy 2008  . Gastric bypass   . Incise and drain abcess     perirectal abscess  :   CURRENT MEDS: Current Outpatient Prescriptions  Medication Sig Dispense Refill  . meloxicam (MOBIC) 7.5 MG tablet Take 7.5 mg by mouth daily. 1-2 times/day      . oxyCODONE-acetaminophen (ROXICET) 5-325 MG per tablet Take 1 tablet by mouth every 4 (four) hours as needed for pain.  20 tablet  0       Allergies  Allergen Reactions  . Norco (Hydrocodone-Acetaminophen) Nausea And Vomiting  :  History reviewed. No pertinent family history.:  History   Social History  . Marital Status: Single    Spouse Name: N/A    Number of Children: N/A  . Years of Education: N/A   Occupational History  . Not on file.   Social History Main Topics  . Smoking status: Never Smoker   . Smokeless tobacco: Never Used  . Alcohol Use: No  . Drug Use: No  . Sexually Active: Yes   Other Topics Concern  . Not on file   Social History Narrative  . No narrative on file  :  REVIEW OF SYSTEM:  The rest of the 14-point review of sytem was negative.   Exam: Filed Vitals:   11/21/12 1309  BP: 135/93  Pulse: 78  Temp: 98.4 F (36.9 C)  TempSrc: Oral  Resp: 20  Height: 5' (1.524 m)  Weight: 246 lb 6.4 oz (111.766 kg)     General:  well-nourished in no acute distress.  Eyes:  no scleral icterus.  ENT:  There were no oropharyngeal lesions.  Neck was without thyromegaly.  Lymphatics:  Negative cervical, supraclavicular or axillary adenopathy.  Respiratory: lungs were clear bilaterally without wheezing or crackles.  Cardiovascular:  Regular rate and rhythm, S1/S2, without murmur, rub or gallop.  There was no pedal edema.  GI:  abdomen was soft, flat, nontender, nondistended,  without organomegaly.  Muscoloskeletal:  no spinal tenderness of palpation of vertebral spine.  Skin exam was without echymosis, petichae.  Neuro exam was nonfocal.  Patient was able to get on and off exam table without assistance.  Gait was normal.  Patient was alerted and oriented.  Attention was good.   Language was appropriate.  Mood was normal without depression.  Speech was not pressured.  Thought content was not tangential.    LABS:  Lab Results  Component Value Date   WBC 4.6 11/21/2012   HGB 12.6 11/21/2012   HCT 38.5 11/21/2012   PLT 322 11/21/2012   GLUCOSE 79 11/21/2012   CHOL 146 09/20/2010   TRIG 49.0  09/20/2010   HDL 48.10 09/20/2010   LDLCALC 88 09/20/2010   ALT 22 11/21/2012   AST 18 11/21/2012   NA 142 11/21/2012   K 3.7 11/21/2012   CL 109* 11/21/2012   CREATININE 0.9 11/21/2012   BUN 10.0 11/21/2012   CO2 26 11/21/2012   INR 1.1 ratio* 09/20/2010    ASSESSMENT AND PLAN: 41 year old female with history of gastric bypass. Recently evaluated by Dr. Loreta Ave for change in bowel habits. She was found to be anemic with a low ferritin. She is noncompliant with iron since she cannot tolerate it. She therefore was referred to hematology for management. I have checked her iron studies today. I do think that she would be a good candidate for parenteral iron since there may be the possibility of noncompliance and in tolerance as well as lack of absorption. I have discussed details of parenteral iron with her. She understands. In fact we will give her a dose today. She understands the risks and benefits of iron. We will check her iron studies in 2-3 months time to see if we have replenished her iron stores.  The length of time of the face-to-face encounter was 45  minutes. More than 50% of time was spent counseling and coordination of care.  Drue Second, MD Medical/Oncology [facility] [phone] (beeper) [phone] (Office)

## 2012-11-21 NOTE — Patient Instructions (Signed)
Feraheme (Ferumoxytol)  What is this medicine? FERUMOXYTOL is an iron complex. Iron is used to make healthy red blood cells, which carry oxygen and nutrients throughout the body. This medicine is used to treat iron deficiency anemia in people with chronic kidney disease. This medicine may be used for other purposes; ask your health care provider or pharmacist if you have questions.  What should I tell my health care provider before I take this medicine? They need to know if you have any of these conditions: -anemia not caused by low iron levels -high levels of iron in the blood -magnetic resonance imaging (MRI) test scheduled -an unusual or allergic reaction to iron, other medicines, foods, dyes, or preservatives -pregnant or trying to get pregnant -breast-feeding  How should I use this medicine? This medicine is for infusion into a vein. It is given by a health care professional in a hospital or clinic setting. Talk to your pediatrician regarding the use of this medicine in children. Special care may be needed. Overdosage: If you think you've taken too much of this medicine contact a poison control center or emergency room at once. Overdosage: If you think you have taken too much of this medicine contact a poison control center or emergency room at once. NOTE: This medicine is only for you. Do not share this medicine with others. What if I miss a dose? It is important not to miss your dose. Call your doctor or health care professional if you are unable to keep an appointment.  What may interact with this medicine? This medicine may interact with the following medications: -other iron products This list may not describe all possible interactions. Give your health care provider a list of all the medicines, herbs, non-prescription drugs, or dietary supplements you use. Also tell them if you smoke, drink alcohol, or use illegal drugs. Some items may interact with your medicine.  What should  I watch for while using this medicine? Visit your doctor or healthcare professional regularly. Tell your doctor or healthcare professional if your symptoms do not start to get better or if they get worse. You may need blood work done while you are taking this medicine. You may need to follow a special diet. Talk to your doctor. Foods that contain iron include: whole grains/cereals, dried fruits, beans, or peas, leafy green vegetables, and organ meats (liver, kidney).  What side effects may I notice from receiving this medicine? Side effects that you should report to your doctor or health care professional as soon as possible: -allergic reactions like skin rash, itching or hives, swelling of the face, lips, or tongue -breathing problems -changes in blood pressure -feeling faint or lightheaded, falls -fever or chills -flushing, sweating, or hot feelings -swelling of the ankles or feet  Side effects that usually do not require medical attention (Report these to your doctor or health care professional if they continue or are bothersome.): -diarrhea -headache -nausea, vomiting -stomach pain This list may not describe all possible side effects. Call your doctor for medical advice about side effects. You may report side effects to FDA at 1-800-FDA-1088. Where should I keep my medicine? This drug is given in a hospital or clinic and will not be stored at home. NOTE: This sheet is a summary. It may not cover all possible information. If you have questions about this medicine, talk to your doctor, pharmacist, or health care provider.  2012, [employer] Standard. (08/19/2008 9:48:25 PM) 

## 2012-11-21 NOTE — Telephone Encounter (Signed)
Gave patient appointment for 01-2013  Sent patient back to the lab on 11-21-2012  Patient received iv iron on 11-21-2012

## 2012-11-27 ENCOUNTER — Encounter (INDEPENDENT_AMBULATORY_CARE_PROVIDER_SITE_OTHER): Payer: Self-pay

## 2013-01-15 ENCOUNTER — Encounter: Payer: Self-pay | Admitting: Physician Assistant

## 2013-01-15 ENCOUNTER — Ambulatory Visit (INDEPENDENT_AMBULATORY_CARE_PROVIDER_SITE_OTHER): Payer: Managed Care, Other (non HMO) | Admitting: Physician Assistant

## 2013-01-15 VITALS — BP 110/74 | HR 104 | Ht 60.0 in | Wt 232.0 lb

## 2013-01-15 DIAGNOSIS — K5289 Other specified noninfective gastroenteritis and colitis: Secondary | ICD-10-CM

## 2013-01-15 DIAGNOSIS — K529 Noninfective gastroenteritis and colitis, unspecified: Secondary | ICD-10-CM

## 2013-01-15 DIAGNOSIS — D1720 Benign lipomatous neoplasm of skin and subcutaneous tissue of unspecified limb: Secondary | ICD-10-CM

## 2013-01-15 DIAGNOSIS — M7520 Bicipital tendinitis, unspecified shoulder: Secondary | ICD-10-CM

## 2013-01-15 DIAGNOSIS — D1739 Benign lipomatous neoplasm of skin and subcutaneous tissue of other sites: Secondary | ICD-10-CM

## 2013-01-15 DIAGNOSIS — M7522 Bicipital tendinitis, left shoulder: Secondary | ICD-10-CM

## 2013-01-15 DIAGNOSIS — I10 Essential (primary) hypertension: Secondary | ICD-10-CM

## 2013-01-15 MED ORDER — GABAPENTIN 100 MG PO CAPS: 100.0000 mg | ORAL_CAPSULE | Freq: Three times a day (TID) | ORAL | Status: DC

## 2013-01-15 NOTE — Progress Notes (Signed)
Subjective:    Patient ID: Peggy [name], female    DOB: Dec 12, 1970, 42 y.o.   MRN: [id number]  Shoulder Pain  The pain is present in the right shoulder and right arm. This is a chronic (Has been going on since may) problem. Episode onset: 6 months.  There has been no history of extremity trauma. The problem occurs constantly. The problem has been unchanged. The quality of the pain is described as aching, burning and sharp. The pain is at a severity of 7/10. The pain is moderate. Associated symptoms include a limited range of motion, numbness, stiffness and tingling. Pertinent negatives include no fever, itching, joint locking or joint swelling. Associated symptoms comments: Can bear some weight with right arm but not without pain.. The symptoms are aggravated by activity and lying down. She has tried heat, cold, NSAIDS, rest and OTC pain meds for the symptoms. The treatment provided mild relief. Family history does not include gout or rheumatoid arthritis. There is no history of gout, osteoarthritis or rheumatoid arthritis.  GI Problem The primary symptoms include abdominal pain, nausea, vomiting and diarrhea. Primary symptoms do not include fever, weight loss, fatigue, melena, hematemesis, jaundice, hematochezia, dysuria, myalgias, arthralgias or rash. The illness began 2 days ago. The onset was sudden. The problem has been gradually improving.  The abdominal pain began 2 days ago. The abdominal pain has been resolved since its onset. The abdominal pain is generalized. The abdominal pain does not radiate. The severity of the abdominal pain is 3/10. The abdominal pain is relieved by bowel movements and being still.  The vomiting began 2 days ago. Vomiting occurs 2 to 5 times per day. The emesis contains stomach contents. Risk factors: NO risk factors.   The diarrhea began today. The diarrhea is semi-solid. The diarrhea occurs 2 to 4 times per day.  The illness does not include chills, anorexia,  dysphagia, odynophagia, bloating, constipation, tenesmus, back pain or itching. Significant associated medical issues include gastric bypass. Associated medical issues do not include inflammatory bowel disease, GERD, liver disease, alcohol abuse, PUD, bowel resection, irritable bowel syndrome, hemorrhoids or diverticulitis.   Anemia/Vitamin D deficiency due to Gastric Bypass. Not currently on any medications. Can not tolerate oral or injection for iron and needs infusion. Has appt on 26th of February for another infusion. Will recheck at [facility]. Colonoscopy done in 2013.   History of HTN. BP great today without medication. Denies any CP, palpitations, SOB, HA's, vision changes. Keeps a low salt diet. Does not exercise.   Lump under right arm. Has been there for 3 months. NO pain and seems to be getting bigger. No trauma to area. NO family hx of breast cancer. Not tried anything to make better or worse.    Fasting labs patient reports were done in October 2013 and were normal. Pap in October 2013 and normal.    Family, social, surgical history was reviewed and updated.   Review of Systems  Constitutional: Negative for fever, chills, weight loss and fatigue.  Gastrointestinal: Positive for nausea, vomiting, abdominal pain and diarrhea. Negative for dysphagia, constipation, melena, hematochezia, bloating, anorexia, hematemesis and jaundice.  Genitourinary: Negative for dysuria.  Musculoskeletal: Positive for stiffness. Negative for myalgias, back pain, arthralgias and gout.  Skin: Negative for itching and rash.  Neurological: Positive for tingling and numbness.       Objective:   Physical Exam  Constitutional: She is oriented to person, place, and time.       Morbidly obese.  HENT:  Head: Normocephalic and atraumatic.  Neck: Normal range of motion. Neck supple.  Cardiovascular: Normal rate, regular rhythm and normal heart sounds.   Pulmonary/Chest: Effort normal and breath sounds normal. She  has no wheezes.  Abdominal: Soft. Bowel sounds are normal. There is no tenderness.  Musculoskeletal:       Right shoulder: Decreased ROM. Not able to left above 90 degrees. Able to conduct external ROM but with pain. Extreme tenderness to palpation over acromion and bicep tendon. Negative drop arm test. Positive empty can test. Decrease strength with abduction 3/5. Hawkins negative.   Lymphadenopathy:    She has no cervical adenopathy.  Neurological: She is alert and oriented to person, place, and time.  Skin: Skin is warm and dry.       Small flucuent/mobile nodule under right axilla. NO erythema and no pain to touch.   Psychiatric: She has a normal mood and affect. Her behavior is normal.          Assessment & Plan:  Right bicep tendonitis- Continue with mobic for next 4 weeks. AFter 2 week only as needed. Gave stretches/exercises for patient to do. Continue to ice and rest as much as possible. Will consider injection/MRI to evaluate if not improving. Did give gabapentin for nerve pain to use up to three times a day. Follow up in 4 weeks.   Lipoma in right axilla- Reassured pt of lipoma. Call if getting bigger or starting to cause pain.  Gastroenteritis BRAT diet. Stay hydrated should continue to get better. Could use imodium for 24 or 48 hours. Call if not improving.   HTN- continue with low salt diet and doing what you are doing.   Anemia/vitamin D/Vitamin B12-Keep scheduled appt for infusion. I would like to get labs in near future.

## 2013-01-15 NOTE — Patient Instructions (Addendum)
Ice area regularly. Continue to use mobic daily. Will get MRI if continues over next 2 weeks.   Biceps Tendon Tendinitis (Proximal) and Tenosynovitis with Rehab Tendonitis and tenosynovitis involve inflammation of the tendon and the tendon lining (sheath). The proximal biceps tendon is vulnerable to tendonitis and tenosynovitis, which causes pain and discomfort in the front of the shoulder and upper arm. The tendon lining secretes a fluid that helps lubricate the tendon, allowing for proper function without pain. When the tendon and its lining become inflamed, the tendon can no longer glide smoothly, causing pain. The proximal biceps tendon connects the biceps muscle to two bones of the shoulder. It is important for proper function of the elbow and turning the palm upward (supination) using the wrist. Proximal biceps tendon tendinitis may include a grade 1 or 2 strain of the tendon. Grade 1 strains involve a slight pull of the tendon without signs of tearing and no observed tendon lengthening. There is also no loss of strength. Grade 2 strains involve small tears in the tendon fibers. The tendon or muscle is stretched and strength is usually decreased.  SYMPTOMS   Pain, tenderness, swelling, warmth, or redness over the front of the shoulder.  Pain that gets worse with shoulder and elbow use, especially against resistance.  Limited motion of the shoulder or elbow.  Crackling sound (crepitation) when the tendon or shoulder is moved or touched. CAUSES  The symptoms of biceps tendonitis are due to inflammation of the tendon. Inflammation may be caused by:  Strain from sudden increase in amount or intensity of activity.  Direct blow or injury to the elbow (uncommon).  Overuse or repetitive elbow bending or wrist rotation, particularly when turning the palm up, or with elbow hyperextension. RISK INCREASES WITH:  Sports that involve contact or overhead arm activity (throwing sports, gymnastics,  weightlifting, bodybuilding, rock climbing).  Heavy labor.  Poor strength and flexibility.  Failure to warm up properly before activity. PREVENTION  Warm up and stretch properly before activity.  Allow time for recovery between activities.  Maintain physical fitness:  Strength, flexibility, and endurance.  Cardiovascular fitness.  Learn and use proper exercise technique. PROGNOSIS  With proper treatment, proximal biceps tendon tendonitis and tenosynovitis is usually curable within 6 weeks. Healing is usually quicker if the cause was a direct blow, not overuse.  RELATED COMPLICATIONS   Longer healing time if not properly treated or if not given enough time to heal.  Chronically inflamed tendon that causes persistent pain with activity, that may progress to constant pain and potentially rupture of the tendon.  Recurring symptoms, especially if activity is resumed too soon or with overuse, a direct blow, or use of poor exercise technique. TREATMENT Treatment first involves ice and medicine, to reduce pain and inflammation. It is helpful to modify activities that cause pain, to reduce the chances of causing the condition to get worse. Strengthening and stretching exercises should be performed to promote proper use of the muscles of the shoulder. These exercises may be performed at home or with a therapist. Other treatments may be given such as ultrasound or heat therapy. A corticosteroid injection may be recommended to help reduce inflammation of the tendon lining. Surgery is usually not necessary. Sometimes, if symptoms last for greater than 6 months, surgery will be advised to detach the tendon and re-insert it into the arm bone. Surgery to correct other shoulder problems that may be contributing to tendinitis may be advised before surgery for the tendinitis itself.  MEDICATION  If pain medicine is needed, nonsteroidal anti-inflammatory medicines (aspirin and ibuprofen), or other minor  pain relievers (acetaminophen), are often advised.  Do not take pain medicine for 7 days before surgery.  Prescription pain relievers may be given if your caregiver thinks they are needed. Use only as directed and only as much as you need.  Corticosteroid injections may be given. These injections should only be used on the most severe cases, as one can only receive a limited number of them. HEAT AND COLD   Cold treatment (icing) should be applied for 10 to 15 minutes every 2 to 3 hours for inflammation and pain, and immediately after activity that aggravates your symptoms. Use ice packs or an ice massage.  Heat treatment may be used before performing stretching and strengthening activities prescribed by your caregiver, physical therapist, or athletic trainer. Use a heat pack or a warm water soak. SEEK MEDICAL CARE IF:   Symptoms get worse or do not improve in 2 weeks, despite treatment.  New, unexplained symptoms develop. (Drugs used in treatment may produce side effects.) EXERCISES RANGE OF MOTION (ROM) AND EXERCISES - Biceps Tendon (Proximal) and Tenosynovitis These exercises may help you when beginning to rehabilitate your injury. Your symptoms may go away with or without further involvement from your physician, physical therapist, or athletic trainer. While completing these exercises, remember:   Restoring tissue flexibility helps normal motion to return to the joints. This allows healthier, less painful movement and activity.  An effective stretch should be held for at least 30 seconds.  A stretch should never be painful. You should only feel a gentle lengthening or release in the stretched tissue. STRETCH  Flexion, Standing  Stand with good posture. With an underhand grip on your right / left hand and an overhand grip on the opposite hand, grasp a broomstick or cane so that your hands are a little more than shoulder width apart.  Keeping your right / left elbow straight and  shoulder muscles relaxed, push the stick with your opposite hand to raise your right / left arm in front of your body and then overhead. Raise your arm until you feel a stretch in your right / left shoulder, but before you have increased shoulder pain.  Try to avoid shrugging your right / left shoulder as your arm rises, by keeping your shoulder blade tucked down and toward your mid-back spine. Hold for __________ seconds.  Slowly return to the starting position. Repeat __________ times. Complete this exercise __________ times per day. STRETCH  Abduction, Supine  Lie on your back. With an underhand grip on your right / left hand and an overhand grip on the opposite hand, grasp a broomstick or cane so that your hands are a little more than shoulder width apart.  Keeping your right / left elbow straight and shoulder muscles relaxed, push the stick with your opposite hand to raise your right / left arm out to the side of your body and then overhead. Raise your arm until you feel a stretch in your right / left shoulder, but before you have increased shoulder pain.  Try to avoid shrugging your right / left shoulder as your arm rises, by keeping your shoulder blade tucked down and toward your mid-back spine. Hold for __________ seconds.  Slowly return to the starting position. Repeat __________ times. Complete this exercise __________ times per day. ROM  Flexion, Active-Assisted  Lie on your back. You may bend your knees for comfort.  Grasp  a broomstick or cane so your hands are about shoulder width apart. Your right / left hand should grip the end of the stick so that your hand is positioned "thumbs-up," as if you were about to shake hands.  Using your healthy arm to lead, raise your right / left arm overhead until you feel a gentle stretch in your shoulder. Hold for __________ seconds.  Use the stick to assist in returning your right / left arm to its starting position. Repeat __________ times.  Complete this exercise __________ times per day.  STRETCH  Flexion, Standing   Stand facing a wall. Walk your right / left fingers up the wall until you feel a moderate stretch in your shoulder. As your hand gets higher, you may need to step closer to the wall or use a door frame to walk through.  Try to avoid shrugging your right / left shoulder as your arm rises, by keeping your shoulder blade tucked down and toward your mid-back spine.  Hold for __________ seconds. Use your other hand, if needed, to ease out of the stretch and return to the starting position. Repeat __________ times. Complete this exercise __________ times per day.  ROM - Internal Rotation   Using underhand grips, grasp a stick behind your back with both hands.  While standing upright with good posture, slide the stick up your back until you feel a mild stretch in the front of your shoulder.  Hold for __________ seconds. Slowly return to your starting position. Repeat __________ times. Complete this exercise __________ times per day.  STRETCH - Internal Rotation  Place your right / left hand behind your back, palm-up.  Throw a towel or belt over your opposite shoulder. Grasp the towel with your right / left hand.  While keeping an upright posture, gently pull up on the towel until you feel a stretch in the front of your right / left shoulder.  Avoid shrugging your right / left shoulder as your arm rises, by keeping your shoulder blade tucked down and toward your mid-back spine.  Hold for __________ seconds. Release the stretch by lowering your opposite hand. Repeat __________ times. Complete this exercise __________ times per day. STRENGTHENING EXERCISES - Biceps Tendon Tendinitis (Proximal) and Tenosynovitis These exercises may help you regain your strength after your physician has discontinued your restraint in a cast or brace. They may resolve your symptoms with or without further involvement from your physician,  physical therapist or athletic trainer. While completing these exercises, remember:   Muscles can gain both the endurance and the strength needed for everyday activities through controlled exercises.  Complete these exercises as instructed by your physician, physical therapist or athletic trainer. Increase the resistance and repetitions only as guided.  You may experience muscle soreness or fatigue, but the pain or discomfort you are trying to eliminate should never worsen during these exercises. If this pain does get worse, stop and make sure you are following the directions exactly. If the pain is still present after adjustments, discontinue the exercise until you can discuss the trouble with your caregiver. STRENGTH - Elbow Flexors, Isometric  Stand or sit upright on a firm surface. Place your right / left arm so that your hand is palm-up and at the height of your waist.  Place your opposite hand on top of your forearm. Gently push down as your right / left arm resists. Push as hard as you can with both arms, without causing any pain or movement at your  right / left elbow. Hold this stationary position for __________ seconds.  Gradually release the tension in both arms. Allow your muscles to relax completely before repeating. Repeat __________ times. Complete this exercise __________ times per day. STRENGTH - Shoulder Flexion, Isometric  With good posture and facing a wall, stand or sit about 4-6 inches away.  Keeping your right / left elbow straight, gently press the top of your fist into the wall. Increase the pressure gradually until you are pressing as hard as you can, without shrugging your shoulder or increasing any shoulder discomfort.  Hold for __________ seconds.  Release the tension slowly. Relax your shoulder muscles completely before you start the next repetition. Repeat __________ times. Complete this exercise __________ times per day.  STRENGTH  Elbow Flexors,  Supinated  With good posture, stand or sit on a firm chair without armrests. Allow your right / left arm to rest at your side with your palm facing forward.  Holding a __________ weight, or gripping a rubber exercise band or tubing,  bring your hand toward your shoulder.  Allow your muscles to control the resistance as your hand returns to your side. Repeat __________ times. Complete this exercise __________ times per day.  STRENGTH - Shoulder Flexion  Stand or sit with good posture. Grasp a __________ weight, or an exercise band or tubing, so that your hand is "thumbs-up," like when you shake hands.  Slowly lift your right / left arm as far as you can, without increasing any shoulder pain. At first, many people can only raise their hand to shoulder height.  Avoid shrugging your right / left shoulder as your arm rises, by keeping your shoulder blade tucked down and toward your mid-back spine.  Hold for __________ seconds. Control the descent of your hand as you slowly return to your starting position. Repeat __________ times. Complete this exercise __________ times per day. Document Released: 11/27/2005 Document Revised: 02/19/2012 Document Reviewed: 03/11/2009 [facility] Patient Information 2013 [place], [place].

## 2013-01-17 DIAGNOSIS — I10 Essential (primary) hypertension: Secondary | ICD-10-CM | POA: Insufficient documentation

## 2013-01-17 NOTE — Patient Instructions (Addendum)
Gave handout for stretches for Bicep tendonitis. Continue to use Mobic once daily. Since having nerve pain gave neurontin to use 100mg  once a day and up to three times a day. Follow up in 4 weeks.

## 2013-02-05 ENCOUNTER — Other Ambulatory Visit (HOSPITAL_BASED_OUTPATIENT_CLINIC_OR_DEPARTMENT_OTHER): Payer: Managed Care, Other (non HMO) | Admitting: Lab

## 2013-02-05 ENCOUNTER — Encounter: Payer: Self-pay | Admitting: Oncology

## 2013-02-05 ENCOUNTER — Ambulatory Visit (HOSPITAL_BASED_OUTPATIENT_CLINIC_OR_DEPARTMENT_OTHER): Payer: Managed Care, Other (non HMO) | Admitting: Oncology

## 2013-02-05 VITALS — BP 136/82 | HR 86 | Temp 98.3°F | Resp 20 | Ht 60.0 in | Wt 243.2 lb

## 2013-02-05 DIAGNOSIS — D509 Iron deficiency anemia, unspecified: Secondary | ICD-10-CM

## 2013-02-05 DIAGNOSIS — E559 Vitamin D deficiency, unspecified: Secondary | ICD-10-CM

## 2013-02-05 LAB — CBC WITH DIFFERENTIAL/PLATELET
BASO%: 0.3 % (ref 0.0–2.0)
Basophils Absolute: 0 10*3/uL (ref 0.0–0.1)
LYMPH%: 42 % (ref 14.0–49.7)
MCH: 27.7 pg (ref 25.1–34.0)
MONO%: 11.1 % (ref 0.0–14.0)
NEUT%: 45.3 % (ref 38.4–76.8)
Platelets: 345 10*3/uL (ref 145–400)
RDW: 14.6 % — ABNORMAL HIGH (ref 11.2–14.5)
lymph#: 2.3 10*3/uL (ref 0.9–3.3)

## 2013-02-05 LAB — FERRITIN: Ferritin: 151 ng/mL (ref 10–291)

## 2013-02-05 LAB — IRON AND TIBC
%SAT: 33 % (ref 20–55)
TIBC: 243 ug/dL — ABNORMAL LOW (ref 250–470)
UIBC: 162 ug/dL (ref 125–400)

## 2013-02-05 NOTE — Patient Instructions (Addendum)
We will call you with results of your iron studies and if needed set up an iron infusion  Otherwise I will see you back in 4 months with iron studies to be done prior to your visit

## 2013-02-05 NOTE — Progress Notes (Signed)
OFFICE PROGRESS NOTE  CC  Peggy Gaw, PA-C [address] [place] [address]  DIAGNOSIS: 42 year old [name] with history of iron deficiency anemia.  PRIOR THERAPY:  #1 patient is status post 1 dose of parenteral iron administered December 2013.  CURRENT THERAPY:observation  INTERVAL HISTORY: Peggy [name] 42 y.o. [name] returns for followup visit today. Clinically she seems to be doing well she denies any fevers chills night sweats headaches shortness of breath chest pains palpitations she feels quite well since having received parenteral iron. She has more energy. Her main her the 10 point review of systems is unremarkable per  MEDICAL HISTORY: Past Medical History  Diagnosis Date  . Perirectal abscess   . Iron deficiency anemia, unspecified 11/21/2012    ALLERGIES:  is allergic to norco.  MEDICATIONS:  Current Outpatient Prescriptions  Medication Sig Dispense Refill  . gabapentin (NEURONTIN) 100 MG capsule Take 1 capsule (100 mg total) by mouth 3 (three) times daily.  90 capsule  0  . meloxicam (MOBIC) 7.5 MG tablet Take 7.5 mg by mouth daily. 1-2 times/day       No current facility-administered medications for this visit.    SURGICAL HISTORY:  Past Surgical History  Procedure Laterality Date  . Cystectomy  2008  . Gastric bypass    . Incise and drain abcess      perirectal abscess    REVIEW OF SYSTEMS:  A comprehensive review of systems was negative.   HEALTH MAINTENANCE:  PHYSICAL EXAMINATION: Blood pressure 136/82, pulse 86, temperature 98.3 F (36.8 C), temperature source Oral, resp. rate 20, height 5' (1.524 m), weight 243 lb 3.2 oz (110.315 kg), last menstrual period 01/08/2013. Body mass index is 47.5 kg/(m^2). ECOG PERFORMANCE STATUS: 0 - Asymptomatic   General appearance: alert, cooperative and appears stated age Resp: clear to auscultation bilaterally Cardio: regular rate and rhythm GI: soft, non-tender; bowel sounds  normal; no masses,  no organomegaly Extremities: extremities normal, atraumatic, no cyanosis or edema Neurologic: Grossly normal   LABORATORY DATA: Lab Results  Component Value Date   WBC 5.4 02/05/2013   HGB 11.7 02/05/2013   HCT 34.8 02/05/2013   MCV 82.6 02/05/2013   PLT 345 02/05/2013      Chemistry      Component Value Date/Time   NA 142 11/21/2012 1245   NA 141 09/20/2010 1315   K 3.7 11/21/2012 1245   K 4.3 09/20/2010 1315   CL 109* 11/21/2012 1245   CL 108 09/20/2010 1315   CO2 26 11/21/2012 1245   CO2 25 09/20/2010 1315   BUN 10.0 11/21/2012 1245   BUN 11 09/20/2010 1315   CREATININE 0.9 11/21/2012 1245   CREATININE 0.9 09/20/2010 1315      Component Value Date/Time   CALCIUM 8.7 11/21/2012 1245   CALCIUM 8.8 09/20/2010 1315   ALKPHOS 154* 11/21/2012 1245   ALKPHOS 111 09/20/2010 1315   AST 18 11/21/2012 1245   AST 17 09/20/2010 1315   ALT 22 11/21/2012 1245   ALT 17 09/20/2010 1315   BILITOT 0.26 11/21/2012 1245   BILITOT 0.4 09/20/2010 1315       RADIOGRAPHIC STUDIES:  No results found.  ASSESSMENT: 42 year old [name] with  #1 iron deficiency anemia with hypoferritinemia. She is status post 1 dose of parenteral iron consisting of erythema. She tolerated it very well. She is now asymptomatic. We have checked her iron stores   PLAN:   #1 we will call her with the results of  her iron studies.  #2 she'll be seen back in 6 months time or sooner if need arises.   All questions were answered. The patient knows to call the clinic with any problems, questions or concerns. We can certainly see the patient much sooner if necessary.  I spent 25 minutes counseling the patient face to face. The total time spent in the appointment was 30 minutes.    Drue Second, MD Medical/Oncology [facility] [phone] (beeper) [phone] (Office)  02/05/2013, 3:26 PM

## 2013-04-18 ENCOUNTER — Telehealth: Payer: Self-pay | Admitting: Oncology

## 2013-04-21 ENCOUNTER — Other Ambulatory Visit: Payer: Managed Care, Other (non HMO)

## 2013-04-29 ENCOUNTER — Ambulatory Visit: Payer: Managed Care, Other (non HMO) | Admitting: Oncology

## 2015-02-22 ENCOUNTER — Encounter: Payer: Self-pay | Admitting: *Deleted

## 2015-02-22 ENCOUNTER — Emergency Department (INDEPENDENT_AMBULATORY_CARE_PROVIDER_SITE_OTHER)
Admission: EM | Admit: 2015-02-22 | Discharge: 2015-02-22 | Disposition: A | Discharge status: Home / Self Care | Payer: Managed Care, Other (non HMO) | Source: Home / Self Care | Attending: Family Medicine | Admitting: Family Medicine

## 2015-02-22 DIAGNOSIS — J111 Influenza due to unidentified influenza virus with other respiratory manifestations: Secondary | ICD-10-CM | POA: Diagnosis not present

## 2015-02-22 DIAGNOSIS — H9203 Otalgia, bilateral: Secondary | ICD-10-CM

## 2015-02-22 DIAGNOSIS — J029 Acute pharyngitis, unspecified: Secondary | ICD-10-CM

## 2015-02-22 DIAGNOSIS — R69 Illness, unspecified: Principal | ICD-10-CM

## 2015-02-22 LAB — POCT RAPID STREP A (OFFICE): RAPID STREP A SCREEN: NEGATIVE

## 2015-02-22 MED ORDER — BENZONATATE 200 MG PO CAPS
200.0000 mg | ORAL_CAPSULE | Freq: Every day | ORAL | Status: DC
Start: 2015-02-22 — End: 2016-03-01

## 2015-02-22 MED ORDER — OSELTAMIVIR PHOSPHATE 75 MG PO CAPS: 75.0000 mg | ORAL_CAPSULE | Freq: Two times a day (BID) | ORAL | Status: DC

## 2015-02-22 NOTE — ED Provider Notes (Signed)
CSN: [id number]     Arrival date & time 02/22/15  1658 History   First MD Initiated Contact with Patient 02/22/15 1924     Chief Complaint  Patient presents with  . Otalgia  . Sore Throat      HPI Comments: Patient complains of mild dizziness and and nausea for about 5 days.  Yesterday she developed sore throat, sinus congestion, fatigue, and bilateral earache.  The history is provided by the patient.    Past Medical History  Diagnosis Date  . Perirectal abscess   . Iron deficiency anemia, unspecified 11/21/2012   Past Surgical History  Procedure Laterality Date  . Cystectomy  2008  . Gastric bypass    . Incise and drain abcess      perirectal abscess   Family History  Problem Relation Age of Onset  . Hyperlipidemia Mother   . Hypertension Mother   . Kidney disease Mother   . Hypertension Maternal Grandmother   . Stroke Maternal Grandmother    History  Substance Use Topics  . Smoking status: Never Smoker   . Smokeless tobacco: Never Used  . Alcohol Use: No   OB History    No data available     Review of Systems + sore throat No cough No pleuritic pain + wheezing + nasal congestion + post-nasal drainage No sinus pain/pressure No itchy/red eyes + earache + dizzy No hemoptysis No SOB No fever/chills + nausea No vomiting No abdominal pain No diarrhea No urinary symptoms No skin rash + fatigue No myalgias No headache Used OTC meds without relief  Allergies  Norco  Home Medications   Prior to Admission medications   Medication Sig Start Date End Date Taking? Authorizing Provider  benzonatate (TESSALON) 200 MG capsule Take 1 capsule (200 mg total) by mouth at bedtime. Take as needed for cough 02/22/15   Kandra Nicolas, MD  gabapentin (NEURONTIN) 100 MG capsule Take 1 capsule (100 mg total) by mouth 3 (three) times daily. 01/15/13   Jade L Breeback, PA-C  meloxicam (MOBIC) 7.5 MG tablet Take 7.5 mg by mouth daily. 1-2 times/day    Historical Provider,  MD  oseltamivir (TAMIFLU) 75 MG capsule Take 1 capsule (75 mg total) by mouth every 12 (twelve) hours. 02/22/15   Kandra Nicolas, MD   BP 130/82 mmHg  Pulse 94  Temp(Src) 98.5 F (36.9 C) (Oral)  Resp 18  Ht 5' (1.524 m)  Wt 255 lb (115.667 kg)  BMI 49.80 kg/m2  SpO2 100%  LMP 02/10/2015 Physical Exam Nursing notes and Vital Signs reviewed. Appearance:  Patient appears stated age, and in no acute distress.  Patient is obese (BMI 49.8) Eyes:  Pupils are equal, round, and reactive to light and accomodation.  Extraocular movement is intact.  Conjunctivae are not inflamed  Ears:  Canals normal.  Tympanic membranes normal.  Nose:  Mildly congested turbinates.  No sinus tenderness.    Pharynx:  Minimal erythema Neck:  Supple.   Nontender enlarged posterior nodes are palpated bilaterally  Lungs:  Clear to auscultation.  Breath sounds are equal.  Heart:  Regular rate and rhythm without murmurs, rubs, or gallops.  Abdomen:  Nontender without masses or hepatosplenomegaly.  Bowel sounds are present.  No CVA or flank tenderness.  Extremities:  No edema.  No calf tenderness Skin:  No rash present.   ED Course  Procedures  None   Labs Reviewed  POCT RAPID STREP A (OFFICE) negative   Tympanogram:  Normal both  ears Imaging Review    MDM   1. Influenza-like illness    Begin Tamiflu.  Prescription written for Benzonatate [facility]) to take at bedtime for night-time cough.  Take plain guaifenesin (600mg  or1200mg  extended release tabs such as Mucinex) twice daily, with plenty of water, for cough and congestion.  May add Pseudoephedrine for sinus congestion.  Get adequate rest.   May use Afrin nasal spray (or generic oxymetazoline) twice daily for about 5 days.  Also recommend using saline nasal spray several times daily and saline nasal irrigation (AYR is a common brand).   Try warm salt water gargles for sore throat.  Stop all antihistamines for now, and other non-prescription cough/cold  preparations. May take Ibuprofen 200mg , 4 tabs every 8 hours with food for sore throat, body aches, etc.    Follow-up with family doctor if not improving about one week    Kandra Nicolas, MD 02/23/15 1147

## 2015-02-22 NOTE — Discharge Instructions (Signed)
Take plain guaifenesin (600mg  or1200mg  extended release tabs such as Mucinex) twice daily, with plenty of water, for cough and congestion.  May add Pseudoephedrine for sinus congestion.  Get adequate rest.   May use Afrin nasal spray (or generic oxymetazoline) twice daily for about 5 days.  Also recommend using saline nasal spray several times daily and saline nasal irrigation (AYR is a common brand).   Try warm salt water gargles for sore throat.  Stop all antihistamines for now, and other non-prescription cough/cold preparations. May take Ibuprofen 200mg , 4 tabs every 8 hours with food for sore throat, body aches, etc.    Follow-up with family doctor if not improving about one week.

## 2015-02-22 NOTE — ED Notes (Signed)
Pt c/o bilateral ear pain, sore throat, and sinus pressure x 2 wks. She reports an episode of LOC last week. Denies fever.

## 2016-03-01 ENCOUNTER — Encounter: Payer: Self-pay | Admitting: *Deleted

## 2016-03-01 ENCOUNTER — Emergency Department (INDEPENDENT_AMBULATORY_CARE_PROVIDER_SITE_OTHER)
Admission: EM | Admit: 2016-03-01 | Discharge: 2016-03-01 | Disposition: A | Discharge status: Home / Self Care | Payer: Managed Care, Other (non HMO) | Source: Home / Self Care | Attending: Family Medicine | Admitting: Family Medicine

## 2016-03-01 ENCOUNTER — Emergency Department (INDEPENDENT_AMBULATORY_CARE_PROVIDER_SITE_OTHER): Payer: Managed Care, Other (non HMO)

## 2016-03-01 DIAGNOSIS — R05 Cough: Secondary | ICD-10-CM

## 2016-03-01 DIAGNOSIS — J209 Acute bronchitis, unspecified: Secondary | ICD-10-CM

## 2016-03-01 MED ORDER — AZITHROMYCIN 250 MG PO TABS: ORAL_TABLET | ORAL | Status: DC

## 2016-03-01 MED ORDER — BENZONATATE 200 MG PO CAPS: 200.0000 mg | ORAL_CAPSULE | Freq: Every day | ORAL | Status: DC

## 2016-03-01 NOTE — ED Provider Notes (Signed)
CSN: [id number]     Arrival date & time 03/01/16  1226 History   First MD Initiated Contact with Patient 03/01/16 1334     Chief Complaint  Patient presents with  . Cough      HPI Comments: About two weeks ago patient developed typical cold-like symptoms developing over several days,  including mild sore throat, sinus congestion, fatigue, and cough.  Her non-productive cough has persisted and she often coughs until she gags.  Her cough is worse at night.  The history is provided by the patient.    Past Medical History  Diagnosis Date  . Perirectal abscess   . Iron deficiency anemia, unspecified 11/21/2012   Past Surgical History  Procedure Laterality Date  . Cystectomy  2008  . Gastric bypass    . Incise and drain abcess      perirectal abscess   Family History  Problem Relation Age of Onset  . Hyperlipidemia Mother   . Hypertension Mother   . Kidney disease Mother   . Hypertension Maternal Grandmother   . Stroke Maternal Grandmother    Social History  Substance Use Topics  . Smoking status: Never Smoker   . Smokeless tobacco: Never Used  . Alcohol Use: No   OB History    No data available     Review of Systems + sore throat + cough No pleuritic pain + wheezing + nasal congestion + post-nasal drainage No sinus pain/pressure No itchy/red eyes No earache No hemoptysis + SOB No fever, + chills/sweats No nausea No vomiting No abdominal pain No diarrhea No urinary symptoms No skin rash + fatigue No myalgias No headache Used OTC meds without relief  Allergies  Norco  Home Medications   Prior to Admission medications   Medication Sig Start Date End Date Taking? Authorizing Provider  azithromycin (ZITHROMAX Z-PAK) 250 MG tablet Take 2 tabs today; then begin one tab once daily for 4 more days. 03/01/16   Kandra Nicolas, MD  benzonatate (TESSALON) 200 MG capsule Take 1 capsule (200 mg total) by mouth at bedtime. Take as needed for cough 03/01/16   Kandra Nicolas, MD   Meds Ordered and Administered this Visit  Medications - No data to display  BP 113/82 mmHg  Pulse 89  Temp(Src) 98.6 F (37 C) (Oral)  Resp 18  Ht 5' (1.524 m)  Wt 249 lb (112.946 kg)  BMI 48.63 kg/m2  SpO2 99%  LMP 02/10/2016 No data found.   Physical Exam Nursing notes and Vital Signs reviewed. Appearance:  Patient appears stated age, and in no acute distress.  Patient is obese (BMI 48.6) Eyes:  Pupils are equal, round, and reactive to light and accomodation.  Extraocular movement is intact.  Conjunctivae are not inflamed  Ears:  Canals normal.  Tympanic membranes normal.  Nose:  Mildly congested turbinates.  No sinus tenderness.   Pharynx:  Normal Neck:  Supple.  No adenopathy Lungs:  Clear to auscultation.  Breath sounds are equal.  Moving air well. Heart:  Regular rate and rhythm without murmurs, rubs, or gallops.  Abdomen:  Nontender without masses or hepatosplenomegaly.  Bowel sounds are present.  No CVA or flank tenderness.  Extremities:  No edema.  Skin:  No rash present.   ED Course  Procedures none   Imaging Review Dg Chest 2 View  03/01/2016  CLINICAL DATA:  Cough for 2 weeks.  Tiredness. EXAM: CHEST  2 VIEW COMPARISON:  None. FINDINGS: The heart size and mediastinal  contours are within normal limits. Both lungs are clear. The visualized skeletal structures are unremarkable. IMPRESSION: No active cardiopulmonary disease. Electronically Signed   By: Staci Righter M.D.   On: 03/01/2016 13:34      MDM   1. Acute bronchitis, unspecified organism    Begin Z-pak for atypical coverage.  Prescription written for Benzonatate [facility]) to take at bedtime for night-time cough.  Take plain guaifenesin (1200mg  extended release tabs such as Mucinex) twice daily, with plenty of water, for cough and congestion.  May add Pseudoephedrine (30mg , one or two every 4 to 6 hours) for sinus congestion.  Get adequate rest.   May use Afrin nasal spray (or generic  oxymetazoline) twice daily for about 5 days and then discontinue.  Also recommend using saline nasal spray several times daily and saline nasal irrigation (AYR is a common brand).  Use Flonase nasal spray each morning after using Afrin nasal spray and saline nasal irrigation. Try warm salt water gargles for sore throat.  Stop all antihistamines for now, and other non-prescription cough/cold preparations.   Follow-up with family doctor if not improving about 7 to10 days.     Kandra Nicolas, MD 03/09/16 1101

## 2016-03-01 NOTE — Discharge Instructions (Signed)
Take plain guaifenesin (1200mg  extended release tabs such as Mucinex) twice daily, with plenty of water, for cough and congestion.  May add Pseudoephedrine (30mg , one or two every 4 to 6 hours) for sinus congestion.  Get adequate rest.   May use Afrin nasal spray (or generic oxymetazoline) twice daily for about 5 days and then discontinue.  Also recommend using saline nasal spray several times daily and saline nasal irrigation (AYR is a common brand).  Use Flonase nasal spray each morning after using Afrin nasal spray and saline nasal irrigation. Try warm salt water gargles for sore throat.  Stop all antihistamines for now, and other non-prescription cough/cold preparations.   Follow-up with family doctor if not improving about 7 to10 days.

## 2016-03-01 NOTE — ED Notes (Signed)
Pt c/o nonproductive cough, chest congestion, fatigue and dizziness at times x 2 wks. Denies fever.

## 2016-11-28 IMAGING — CR DG CHEST 2V
2 series · 2 of 2 positions shown · non-contrast
Comparison: None.

CLINICAL DATA: Cough for 2 weeks.  Tiredness.

EXAM:
CHEST  2 VIEW

[chest pa]
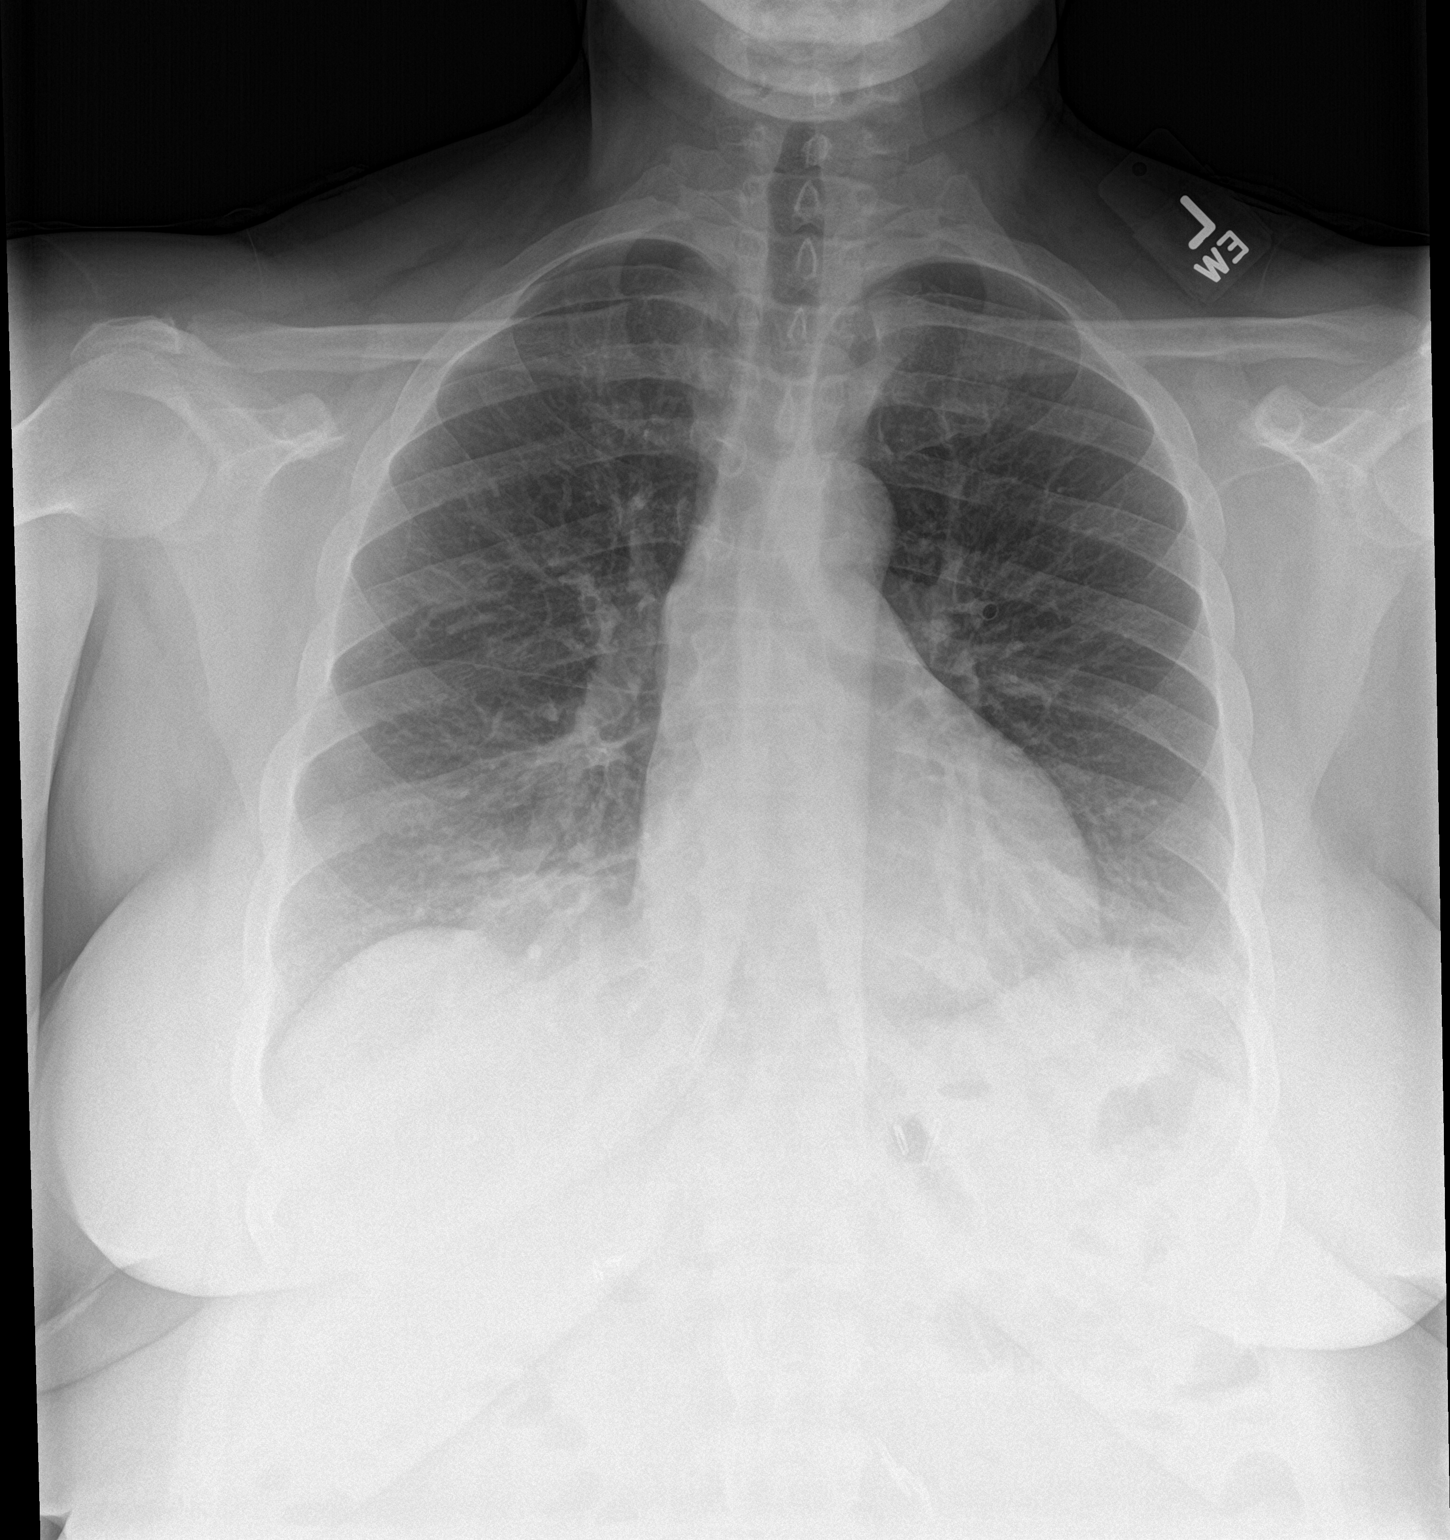

[chest lat]
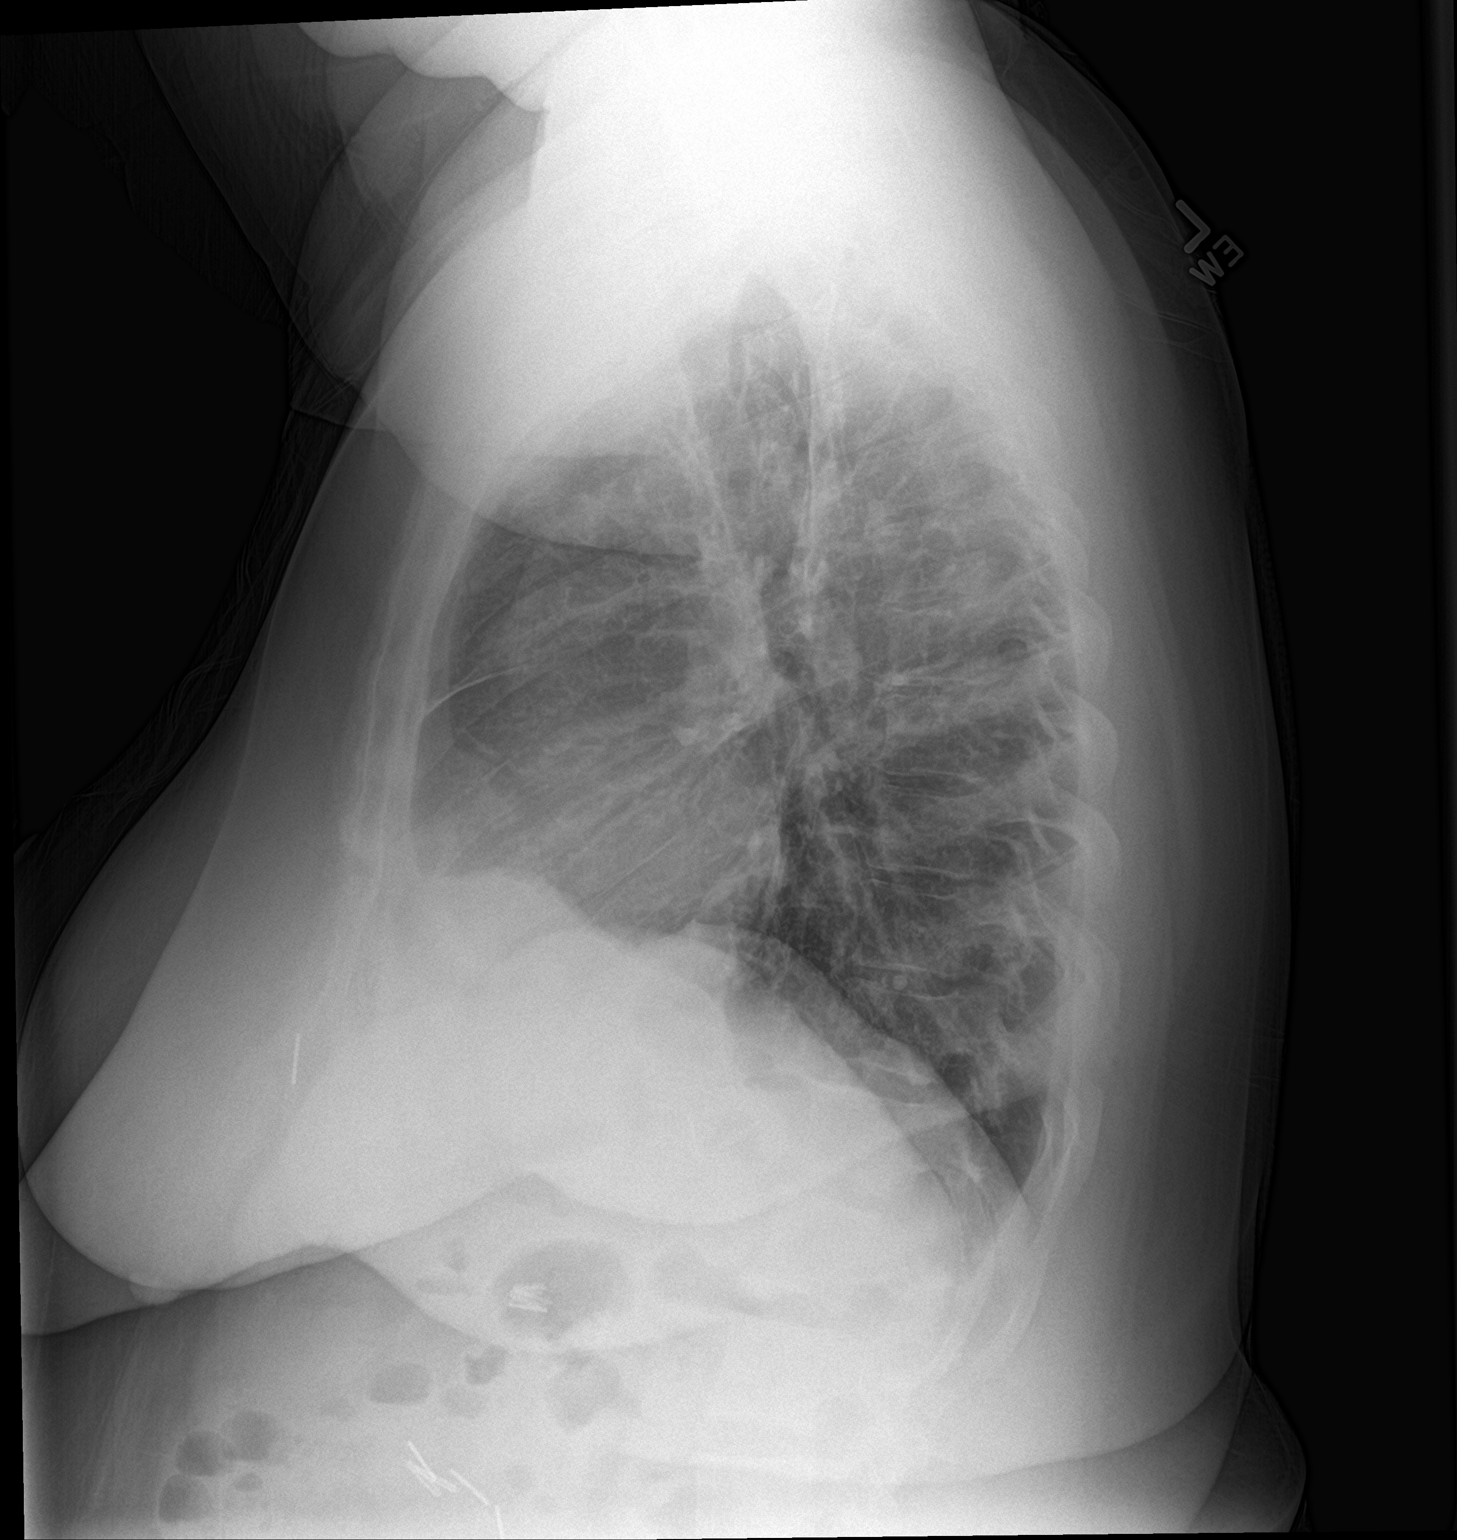

[2 of 2 positions shown; findings below may reference images not displayed]

FINDINGS: The heart size and mediastinal contours are within normal limits.
Both lungs are clear. The visualized skeletal structures are
unremarkable.
IMPRESSION: No active cardiopulmonary disease.

## 2020-04-07 LAB — HM MAMMOGRAPHY: HM Mammogram: NORMAL (ref 0–4)

## 2021-04-04 ENCOUNTER — Ambulatory Visit (INDEPENDENT_AMBULATORY_CARE_PROVIDER_SITE_OTHER): Payer: No Typology Code available for payment source | Admitting: Internal Medicine

## 2021-04-04 ENCOUNTER — Encounter: Payer: Self-pay | Admitting: *Deleted

## 2021-04-04 ENCOUNTER — Other Ambulatory Visit: Payer: Self-pay

## 2021-04-04 ENCOUNTER — Telehealth: Payer: Self-pay | Admitting: Internal Medicine

## 2021-04-04 ENCOUNTER — Encounter (INDEPENDENT_AMBULATORY_CARE_PROVIDER_SITE_OTHER): Payer: Self-pay

## 2021-04-04 ENCOUNTER — Encounter: Payer: Self-pay | Admitting: Internal Medicine

## 2021-04-04 VITALS — BP 156/100 | HR 94 | Temp 98.4°F | Resp 16 | Ht 60.0 in | Wt 253.0 lb

## 2021-04-04 DIAGNOSIS — D508 Other iron deficiency anemias: Secondary | ICD-10-CM | POA: Diagnosis not present

## 2021-04-04 DIAGNOSIS — Z9884 Bariatric surgery status: Secondary | ICD-10-CM

## 2021-04-04 DIAGNOSIS — Z0001 Encounter for general adult medical examination with abnormal findings: Secondary | ICD-10-CM | POA: Diagnosis not present

## 2021-04-04 DIAGNOSIS — R002 Palpitations: Secondary | ICD-10-CM | POA: Diagnosis not present

## 2021-04-04 DIAGNOSIS — E559 Vitamin D deficiency, unspecified: Secondary | ICD-10-CM | POA: Diagnosis not present

## 2021-04-04 DIAGNOSIS — I10 Essential (primary) hypertension: Secondary | ICD-10-CM | POA: Diagnosis not present

## 2021-04-04 DIAGNOSIS — R7989 Other specified abnormal findings of blood chemistry: Secondary | ICD-10-CM | POA: Insufficient documentation

## 2021-04-04 DIAGNOSIS — Z23 Encounter for immunization: Secondary | ICD-10-CM

## 2021-04-04 LAB — CBC WITH DIFFERENTIAL/PLATELET
Basophils Absolute: 0 10*3/uL (ref 0.0–0.1)
Basophils Relative: 0.4 % (ref 0.0–3.0)
Eosinophils Absolute: 0 10*3/uL (ref 0.0–0.7)
Eosinophils Relative: 1.1 % (ref 0.0–5.0)
HCT: 35.7 % — ABNORMAL LOW (ref 36.0–46.0)
Hemoglobin: 11.7 g/dL — ABNORMAL LOW (ref 12.0–15.0)
Lymphocytes Relative: 31 % (ref 12.0–46.0)
Lymphs Abs: 1.4 10*3/uL (ref 0.7–4.0)
MCHC: 32.8 g/dL (ref 30.0–36.0)
MCV: 79.2 fl (ref 78.0–100.0)
Monocytes Absolute: 0.4 10*3/uL (ref 0.1–1.0)
Monocytes Relative: 8 % (ref 3.0–12.0)
Neutro Abs: 2.6 10*3/uL (ref 1.4–7.7)
Neutrophils Relative %: 59.5 % (ref 43.0–77.0)
Platelets: 327 10*3/uL (ref 150.0–400.0)
RBC: 4.51 Mil/uL (ref 3.87–5.11)
RDW: 14.7 % (ref 11.5–15.5)
WBC: 4.4 10*3/uL (ref 4.0–10.5)

## 2021-04-04 LAB — URINALYSIS, ROUTINE W REFLEX MICROSCOPIC
Bilirubin Urine: NEGATIVE
Hgb urine dipstick: NEGATIVE
Nitrite: NEGATIVE
RBC / HPF: NONE SEEN (ref 0–?)
Specific Gravity, Urine: 1.02 (ref 1.000–1.030)
Total Protein, Urine: NEGATIVE
Urine Glucose: NEGATIVE
Urobilinogen, UA: 0.2 (ref 0.0–1.0)
pH: 6 (ref 5.0–8.0)

## 2021-04-04 LAB — BASIC METABOLIC PANEL
BUN: 10 mg/dL (ref 6–23)
CO2: 21 mEq/L (ref 19–32)
Calcium: 8.7 mg/dL (ref 8.4–10.5)
Chloride: 109 mEq/L (ref 96–112)
Creatinine, Ser: 0.86 mg/dL (ref 0.40–1.20)
GFR: 79 mL/min (ref >60.00)
Glucose, Bld: 76 mg/dL (ref 70–99)
Potassium: 3.4 mEq/L — ABNORMAL LOW (ref 3.5–5.1)
Sodium: 140 mEq/L (ref 135–145)

## 2021-04-04 LAB — LIPID PANEL
Cholesterol: 133 mg/dL (ref 0–200)
HDL: 40.1 mg/dL (ref >39.00)
LDL Cholesterol: 79 mg/dL (ref 0–99)
NonHDL: 93.02
Total CHOL/HDL Ratio: 3
Triglycerides: 69 mg/dL (ref 0.0–149.0)
VLDL: 13.8 mg/dL (ref 0.0–40.0)

## 2021-04-04 LAB — HEPATIC FUNCTION PANEL
ALT: 81 U/L — ABNORMAL HIGH (ref 0–35)
AST: 39 U/L — ABNORMAL HIGH (ref 0–37)
Albumin: 3.6 g/dL (ref 3.5–5.2)
Alkaline Phosphatase: 114 U/L (ref 39–117)
Bilirubin, Direct: 0.1 mg/dL (ref 0.0–0.3)
Total Bilirubin: 0.3 mg/dL (ref 0.2–1.2)
Total Protein: 7.3 g/dL (ref 6.0–8.3)

## 2021-04-04 LAB — TSH: TSH: 3.61 u[IU]/mL (ref 0.35–4.50)

## 2021-04-04 LAB — IRON: Iron: 28 ug/dL — ABNORMAL LOW (ref 42–145)

## 2021-04-04 LAB — VITAMIN B12: Vitamin B-12: 459 pg/mL (ref 211–911)

## 2021-04-04 LAB — HEMOGLOBIN A1C: Hgb A1c MFr Bld: 5.8 % (ref 4.6–6.5)

## 2021-04-04 LAB — VITAMIN D 25 HYDROXY (VIT D DEFICIENCY, FRACTURES): VITD: 17.92 ng/mL — ABNORMAL LOW (ref 30.00–100.00)

## 2021-04-04 MED ORDER — ACCRUFER 30 MG PO CAPS
1.0000 | ORAL_CAPSULE | Freq: Two times a day (BID) | ORAL | 1 refills | Status: DC
Start: 2021-04-04 — End: 2021-04-27

## 2021-04-04 MED ORDER — CHOLECALCIFEROL 1.25 MG (50000 UT) PO CAPS: 50000.0000 [IU] | ORAL_CAPSULE | ORAL | 1 refills | Status: DC

## 2021-04-04 MED ORDER — TRIAMTERENE-HCTZ 37.5-25 MG PO CAPS: 1.0000 | ORAL_CAPSULE | Freq: Every day | ORAL | 0 refills | Status: DC

## 2021-04-04 NOTE — Telephone Encounter (Signed)
Patient called and said that her pharmacy told her that Ferric Maltol (ACCRUFER) 30 MG CAPS was not covered under her insurance. She was wondering if something else could be sent to [facility] [place] [facility], [place] Please advise

## 2021-04-04 NOTE — Patient Instructions (Signed)

## 2021-04-04 NOTE — Progress Notes (Signed)
Subjective:  Patient ID: Peggy [name], female    DOB: Jan 21, 1971  Age: 50 y.o. MRN: [id number]  CC: Annual Exam and Hypertension  This visit occurred during the SARS-CoV-2 public health emergency.  Safety protocols were in place, including screening questions prior to the visit, additional usage of staff PPE, and extensive cleaning of exam room while observing appropriate contact time as indicated for disinfecting solutions.    HPI Peggy [name] presents for a CPX and to establish.  She complains of a 39-month history of intermittent tachypalpitations.  She notices it mostly when she is laying in the bed and she feels like her heart is racing. It happens rarely. The palpitations make her feel anxious but she denies chest pain, dizziness, lightheadedness, syncope, near syncope, edema, or fatigue.  She has had a few episodes when the palpitations occurred while she was sitting at her desk at work.  She has a history of hypertension that is not currently being treated.  History [name] has a past medical history of Depression, Hypertension, Iron deficiency anemia, unspecified (11/21/2012), Perirectal abscess, and UTI (urinary tract infection).   She has a past surgical history that includes Cystectomy (2008); Gastric bypass; and Incise and drain abcess.   Her family history includes COPD in her mother; Depression in her sister; Hearing loss in her sister; Hyperlipidemia in her mother; Hypertension in her maternal grandmother and mother; Kidney disease in her mother; Stroke in her maternal grandmother.She reports that she has never smoked. She has never used smokeless tobacco. She reports that she does not drink alcohol and does not use drugs.  Outpatient Medications Prior to Visit  Medication Sig Dispense Refill  . LO LOESTRIN FE 1 MG-10 MCG / 10 MCG tablet Take 1 tablet by mouth daily.    . Prenatal Vit-Fe Fumarate-FA (PRENATAL VITAMIN PO) Take by mouth.    Marland Kitchen azithromycin (ZITHROMAX  Z-PAK) 250 MG tablet Take 2 tabs today; then begin one tab once daily for 4 more days. 6 tablet 0  . benzonatate (TESSALON) 200 MG capsule Take 1 capsule (200 mg total) by mouth at bedtime. Take as needed for cough 12 capsule 0   No facility-administered medications prior to visit.    ROS Review of Systems  Constitutional: Negative for appetite change, chills, diaphoresis, fatigue, fever and unexpected weight change.  HENT: Negative.   Eyes: Negative.  Negative for visual disturbance.  Respiratory: Negative for apnea, choking, shortness of breath and wheezing.   Cardiovascular: Positive for palpitations. Negative for chest pain and leg swelling.  Gastrointestinal: Negative.  Negative for abdominal pain, constipation, diarrhea, nausea and vomiting.  Endocrine: Negative.   Genitourinary: Negative.  Negative for difficulty urinating, frequency and hematuria.  Musculoskeletal: Negative.  Negative for arthralgias and myalgias.  Skin: Negative.  Negative for color change and rash.  Neurological: Negative for dizziness, weakness, light-headedness, numbness and headaches.  Hematological: Negative for adenopathy. Does not bruise/bleed easily.  Psychiatric/Behavioral: Negative for behavioral problems, confusion, decreased concentration, dysphoric mood, sleep disturbance and suicidal ideas. The patient is nervous/anxious.     Objective:  BP (!) 156/100 (BP Location: Left Arm, Patient Position: Sitting, Cuff Size: Large)   Pulse 94   Temp 98.4 F (36.9 C) (Oral)   Resp 16   Ht 5' (1.524 m)   Wt 253 lb (114.8 kg)   LMP 03/19/2021   SpO2 98%   BMI 49.41 kg/m   Physical Exam Vitals reviewed.  Constitutional:      Appearance: She is obese.  She is not ill-appearing.  HENT:     Nose: Nose normal.     Mouth/Throat:     Mouth: Mucous membranes are moist.  Eyes:     General: No scleral icterus.    Conjunctiva/sclera: Conjunctivae normal.  Cardiovascular:     Rate and Rhythm: Normal rate  and regular rhythm.     Heart sounds: Normal heart sounds, S1 normal and S2 normal. No murmur heard. No gallop.      Comments: EKG- NSR, 81 bpm Normal EKG Pulmonary:     Effort: Pulmonary effort is normal.     Breath sounds: No stridor. No wheezing, rhonchi or rales.  Abdominal:     General: Abdomen is protuberant. Bowel sounds are normal. There is no distension.     Palpations: Abdomen is soft. There is no fluid wave, hepatomegaly, splenomegaly or mass.     Tenderness: There is no abdominal tenderness.  Musculoskeletal:        General: Normal range of motion.     Cervical back: Neck supple.     Right lower leg: No edema.     Left lower leg: No edema.  Lymphadenopathy:     Cervical: No cervical adenopathy.  Skin:    General: Skin is warm and dry.     Coloration: Skin is not pale.  Neurological:     General: No focal deficit present.     Mental Status: She is alert and oriented to person, place, and time. Mental status is at baseline.  Psychiatric:        Mood and Affect: Mood normal.        Behavior: Behavior normal.        Thought Content: Thought content normal.        Judgment: Judgment normal.     Lab Results  Component Value Date   WBC 4.4 04/04/2021   HGB 11.7 (L) 04/04/2021   HCT 35.7 (L) 04/04/2021   PLT 327.0 04/04/2021   GLUCOSE 76 04/04/2021   CHOL 133 04/04/2021   TRIG 69.0 04/04/2021   HDL 40.10 04/04/2021   LDLCALC 79 04/04/2021   ALT 81 (H) 04/04/2021   AST 39 (H) 04/04/2021   NA 140 04/04/2021   K 3.4 (L) 04/04/2021   CL 109 04/04/2021   CREATININE 0.86 04/04/2021   BUN 10 04/04/2021   CO2 21 04/04/2021   TSH 3.61 04/04/2021   INR 1.1 ratio (H) 09/20/2010   HGBA1C 5.8 04/04/2021    Assessment & Plan:   Berenize was seen today for annual exam and hypertension.  Diagnoses and all orders for this visit:  Primary hypertension- She has stage II-III HTN and a low K+. Will check labs to screen for secondary causes and will treat with  HCTZ/triamterene. -     Basic metabolic panel; Future -     TSH; Future -     Urinalysis, Routine w reflex microscopic; Future -     VITAMIN D 25 Hydroxy (Vit-D Deficiency, Fractures); Future -     Hepatic function panel; Future -     Aldosterone + renin activity w/ ratio; Future -     CBC with Differential/Platelet; Future -     EKG 12-Lead -     CBC with Differential/Platelet -     Aldosterone + renin activity w/ ratio -     Hepatic function panel -     VITAMIN D 25 Hydroxy (Vit-D Deficiency, Fractures) -     Urinalysis, Routine w reflex microscopic -  TSH -     Basic metabolic panel -     triamterene-hydrochlorothiazide (DYAZIDE) 37.5-25 MG capsule; Take 1 each (1 capsule total) by mouth daily.  Iron deficiency anemia secondary to inadequate dietary iron intake- I have recommend that she start iron replacement therapy. -     Iron; Future -     CBC with Differential/Platelet; Future -     CBC with Differential/Platelet -     Iron -     Ferric Maltol (ACCRUFER) 30 MG CAPS; Take 1 capsule by mouth in the morning and at bedtime.  Vitamin D deficiency disease -     VITAMIN D 25 Hydroxy (Vit-D Deficiency, Fractures); Future -     VITAMIN D 25 Hydroxy (Vit-D Deficiency, Fractures) -     Cholecalciferol 1.25 MG (50000 UT) capsule; Take 1 capsule (50,000 Units total) by mouth once a week.  Bariatric surgery status- Will monitor for deficiencies. -     Vitamin B12; Future -     VITAMIN D 25 Hydroxy (Vit-D Deficiency, Fractures); Future -     Zinc; Future -     Vitamin B1; Future -     Vitamin B1 -     Zinc -     VITAMIN D 25 Hydroxy (Vit-D Deficiency, Fractures) -     Vitamin B12 -     Cholecalciferol 1.25 MG (50000 UT) capsule; Take 1 capsule (50,000 Units total) by mouth once a week. -     Ferric Maltol (ACCRUFER) 30 MG CAPS; Take 1 capsule by mouth in the morning and at bedtime.  Rapid palpitations- This may be related to the anemia. Other labs are okay. I have asked her to  undergo an event monitor to see if there is a dysrhythmia. -     TSH; Future -     TSH -     CARDIAC EVENT MONITOR; Future  Encounter for general adult medical examination with abnormal findings -     Lipid panel; Future -     Hepatitis C antibody; Future -     HIV Antibody (routine testing w rflx); Future -     HIV Antibody (routine testing w rflx) -     Hepatitis C antibody -     Lipid panel  Morbid obesity ([place])- Labs are negative for secondary causes. -     Hemoglobin A1c; Future -     Hemoglobin A1c  Need for Tdap vaccination -     Tdap vaccine greater than or equal to 7yo IM  Elevated LFTs- This may be NASH, I have asked her to undergo an U/S. -     US Abdomen Limited RUQ (LIVER/GB); Future   I have discontinued Trianna B. Spagnolo's azithromycin and benzonatate. I am also having her start on Cholecalciferol, ACCRUFeR, and triamterene-hydrochlorothiazide. Additionally, I am having her maintain her Lo Loestrin Fe and Prenatal Vit-Fe Fumarate-FA (PRENATAL VITAMIN PO).  Meds ordered this encounter  Medications  . Cholecalciferol 1.25 MG (50000 UT) capsule    Sig: Take 1 capsule (50,000 Units total) by mouth once a week.    Dispense:  12 capsule    Refill:  1  . Ferric Maltol (ACCRUFER) 30 MG CAPS    Sig: Take 1 capsule by mouth in the morning and at bedtime.    Dispense:  180 capsule    Refill:  1  . triamterene-hydrochlorothiazide (DYAZIDE) 37.5-25 MG capsule    Sig: Take 1 each (1 capsule total) by mouth daily.  Dispense:  90 capsule    Refill:  0     Follow-up: Return in about 6 weeks (around 05/16/2021).  Scarlette Calico, MD

## 2021-04-04 NOTE — Progress Notes (Signed)
Enrolled pt in for [employer] to ship a 30 day monitor to pt.

## 2021-04-09 LAB — ALDOSTERONE + RENIN ACTIVITY W/ RATIO
ALDO / PRA Ratio: 11.1 Ratio (ref 0.9–28.9)
Aldosterone: 3 ng/dL
Renin Activity: 0.27 ng/mL/h (ref 0.25–5.82)

## 2021-04-09 LAB — HEPATITIS C ANTIBODY
Hepatitis C Ab: NONREACTIVE
SIGNAL TO CUT-OFF: 0.02 (ref <1.00)

## 2021-04-09 LAB — ZINC: Zinc: 78 ug/dL (ref 60–130)

## 2021-04-09 LAB — VITAMIN B1: Vitamin B1 (Thiamine): <6 nmol/L — ABNORMAL LOW (ref 8–30)

## 2021-04-09 LAB — HIV ANTIBODY (ROUTINE TESTING W REFLEX): HIV 1&2 Ab, 4th Generation: NONREACTIVE

## 2021-04-10 ENCOUNTER — Other Ambulatory Visit: Payer: Self-pay | Admitting: Internal Medicine

## 2021-04-10 ENCOUNTER — Ambulatory Visit (INDEPENDENT_AMBULATORY_CARE_PROVIDER_SITE_OTHER): Payer: No Typology Code available for payment source

## 2021-04-10 DIAGNOSIS — R002 Palpitations: Secondary | ICD-10-CM

## 2021-04-10 DIAGNOSIS — E519 Thiamine deficiency, unspecified: Secondary | ICD-10-CM | POA: Insufficient documentation

## 2021-04-10 MED ORDER — VITAMIN B-1 50 MG PO TABS: 50.0000 mg | ORAL_TABLET | Freq: Every day | ORAL | 1 refills | Status: DC

## 2021-04-14 ENCOUNTER — Telehealth: Payer: Self-pay | Admitting: Internal Medicine

## 2021-04-14 NOTE — Telephone Encounter (Signed)
Patient called and said that the heart monitor that she is wearing is irritating her skin. She said that it is burning, itching and hurting her skin. Transferred to team health.

## 2021-04-14 NOTE — Telephone Encounter (Signed)
I have reviewed her chart. Pt has been in contact with cardiology in regard to the skin irritation and has been instructed on the correct steps to take. See original 04/04/21 patient message. A response from cardiology clinical team dated for 04/14/21 in that encounter has been sent to the pt.

## 2021-04-14 NOTE — Telephone Encounter (Signed)
[facility] nurse called and stated the patient is having a reaction to the wearing the monitor. Patient is not aware of any allergies to anything.  Advised patient be seen within 4 hours. Nurse wasn't sure if patient needed to come in or if you just need to just advise her of anything.   Please follow up with patient on what next step should be.

## 2021-04-14 NOTE — Telephone Encounter (Signed)
[facility] Report/Call: ---Caller states that she is wearing a heart monitor that has adhesive that is causing irritation and pain to her skin, she has had the monitor on for 4 days now and is scheduled to have it for 30 days . PCP prescribed the monitor.

## 2021-04-18 ENCOUNTER — Encounter: Payer: Self-pay | Admitting: Internal Medicine

## 2021-04-18 ENCOUNTER — Other Ambulatory Visit: Payer: Self-pay | Admitting: Internal Medicine

## 2021-04-18 DIAGNOSIS — L231 Allergic contact dermatitis due to adhesives: Secondary | ICD-10-CM | POA: Insufficient documentation

## 2021-04-18 MED ORDER — CLOBETASOL PROPIONATE 0.05 % EX OINT: 1.0000 "application " | TOPICAL_OINTMENT | Freq: Two times a day (BID) | CUTANEOUS | 1 refills | Status: DC

## 2021-04-21 ENCOUNTER — Telehealth: Payer: Self-pay

## 2021-04-21 NOTE — Telephone Encounter (Signed)
PA for Ferric Maltol [id number] Waiting on response.

## 2021-04-26 ENCOUNTER — Encounter: Payer: Self-pay | Admitting: Internal Medicine

## 2021-04-27 ENCOUNTER — Encounter: Payer: Self-pay | Admitting: Internal Medicine

## 2021-04-27 ENCOUNTER — Other Ambulatory Visit: Payer: Self-pay

## 2021-04-27 ENCOUNTER — Ambulatory Visit (INDEPENDENT_AMBULATORY_CARE_PROVIDER_SITE_OTHER): Payer: No Typology Code available for payment source | Admitting: Internal Medicine

## 2021-04-27 VITALS — BP 118/74 | HR 119 | Temp 98.4°F | Resp 16 | Ht 60.0 in | Wt 244.0 lb

## 2021-04-27 DIAGNOSIS — D508 Other iron deficiency anemias: Secondary | ICD-10-CM | POA: Diagnosis not present

## 2021-04-27 DIAGNOSIS — E876 Hypokalemia: Secondary | ICD-10-CM | POA: Diagnosis not present

## 2021-04-27 DIAGNOSIS — I10 Essential (primary) hypertension: Secondary | ICD-10-CM

## 2021-04-27 DIAGNOSIS — K7581 Nonalcoholic steatohepatitis (NASH): Secondary | ICD-10-CM

## 2021-04-27 MED ORDER — SPIRONOLACTONE 50 MG PO TABS: 50.0000 mg | ORAL_TABLET | Freq: Every day | ORAL | 1 refills | Status: DC

## 2021-04-27 NOTE — Telephone Encounter (Signed)
PA was denied. Please advise

## 2021-04-27 NOTE — Patient Instructions (Signed)

## 2021-04-27 NOTE — Progress Notes (Signed)
Subjective:  Patient ID: Peggy [name], female    DOB: May 18, 1971  Age: 50 y.o. MRN: [id number]  CC: Hypertension  This visit occurred during the SARS-CoV-2 public health emergency.  Safety protocols were in place, including screening questions prior to the visit, additional usage of staff PPE, and extensive cleaning of exam room while observing appropriate contact time as indicated for disinfecting solutions.    HPI Peggy [name] presents for f/up -  She is not tolerating the combination of triamterene and hydrochlorothiazide.  She complains of fatigue, loss of appetite, dizziness, and lightheadedness.  The prescription for an oral iron supplement was denied by her insurance company.  Outpatient Medications Prior to Visit  Medication Sig Dispense Refill  . Cholecalciferol 1.25 MG (50000 UT) capsule Take 1 capsule (50,000 Units total) by mouth once a week. 12 capsule 1  . LO LOESTRIN FE 1 MG-10 MCG / 10 MCG tablet Take 1 tablet by mouth daily.    . Prenatal Vit-Fe Fumarate-FA (PRENATAL VITAMIN PO) Take by mouth.    . thiamine (VITAMIN B-1) 50 MG tablet Take 1 tablet (50 mg total) by mouth daily. 90 tablet 1  . clobetasol ointment (TEMOVATE) 6.78 % Apply 1 application topically 2 (two) times daily. 45 g 1  . Ferric Maltol (ACCRUFER) 30 MG CAPS Take 1 capsule by mouth in the morning and at bedtime. 180 capsule 1  . triamterene-hydrochlorothiazide (DYAZIDE) 37.5-25 MG capsule Take 1 each (1 capsule total) by mouth daily. 90 capsule 0   No facility-administered medications prior to visit.    ROS Review of Systems  Constitutional: Positive for fatigue. Negative for diaphoresis and unexpected weight change.  HENT: Negative.   Eyes: Negative.   Respiratory: Negative for chest tightness, shortness of breath and wheezing.   Cardiovascular: Negative for chest pain, palpitations and leg swelling.  Gastrointestinal: Negative for abdominal pain, constipation and vomiting.  Endocrine:  Negative.   Genitourinary: Negative.  Negative for difficulty urinating.  Musculoskeletal: Negative for arthralgias and myalgias.  Skin: Negative.  Negative for color change.  Neurological: Positive for dizziness and light-headedness. Negative for weakness and numbness.  Hematological: Negative for adenopathy. Does not bruise/bleed easily.  Psychiatric/Behavioral: Negative.     Objective:  BP 118/74 (BP Location: Left Arm, Patient Position: Sitting, Cuff Size: Large)   Pulse (!) 119   Temp 98.4 F (36.9 C) (Oral)   Resp 16   Ht 5' (1.524 m)   Wt 244 lb (110.7 kg)   SpO2 97%   BMI 47.65 kg/m   BP Readings from Last 3 Encounters:  04/27/21 118/74  04/04/21 (!) 156/100  03/01/16 113/82    Wt Readings from Last 3 Encounters:  04/27/21 244 lb (110.7 kg)  04/04/21 253 lb (114.8 kg)  03/01/16 249 lb (112.9 kg)    Physical Exam Vitals reviewed.  Constitutional:      Appearance: Normal appearance.  HENT:     Nose: Nose normal.     Mouth/Throat:     Mouth: Mucous membranes are moist.  Eyes:     General: No scleral icterus.    Conjunctiva/sclera: Conjunctivae normal.  Cardiovascular:     Rate and Rhythm: Regular rhythm. Tachycardia present.     Heart sounds: No murmur heard.   Pulmonary:     Breath sounds: No stridor. No wheezing, rhonchi or rales.  Abdominal:     General: Abdomen is protuberant. Bowel sounds are normal. There is no distension.     Palpations: Abdomen is soft. There  is no hepatomegaly, splenomegaly or mass.     Tenderness: There is no abdominal tenderness.  Musculoskeletal:        General: Normal range of motion.     Cervical back: Neck supple.     Right lower leg: No edema.     Left lower leg: No edema.  Lymphadenopathy:     Cervical: No cervical adenopathy.  Skin:    General: Skin is warm and dry.  Neurological:     General: No focal deficit present.     Mental Status: She is alert.  Psychiatric:        Mood and Affect: Mood normal.         Behavior: Behavior normal.     Lab Results  Component Value Date   WBC 4.4 04/04/2021   HGB 11.7 (L) 04/04/2021   HCT 35.7 (L) 04/04/2021   PLT 327.0 04/04/2021   GLUCOSE 76 04/04/2021   CHOL 133 04/04/2021   TRIG 69.0 04/04/2021   HDL 40.10 04/04/2021   LDLCALC 79 04/04/2021   ALT 81 (H) 04/04/2021   AST 39 (H) 04/04/2021   NA 140 04/04/2021   K 3.4 (L) 04/04/2021   CL 109 04/04/2021   CREATININE 0.86 04/04/2021   BUN 10 04/04/2021   CO2 21 04/04/2021   TSH 3.61 04/04/2021   INR 1.1 ratio (H) 09/20/2010   HGBA1C 5.8 04/04/2021    DG Chest 2 View  Result Date: 03/01/2016 CLINICAL DATA:  Cough for 2 weeks.  Tiredness. EXAM: CHEST  2 VIEW COMPARISON:  None. FINDINGS: The heart size and mediastinal contours are within normal limits. Both lungs are clear. The visualized skeletal structures are unremarkable. IMPRESSION: No active cardiopulmonary disease. Electronically Signed   By: Staci Righter M.D.   On: 03/01/2016 13:34    Assessment & Plan:   Peggy [name] was seen today for hypertension.  Diagnoses and all orders for this visit:  Iron deficiency anemia secondary to inadequate dietary iron intake- Will refer to hematology for a series of iron infusions. -     Ambulatory referral to Hematology / Oncology  Primary hypertension- Her blood pressure is overcontrolled.  Will switch to spironolactone. -     spironolactone (ALDACTONE) 50 MG tablet; Take 1 tablet (50 mg total) by mouth daily.  Chronic hypokalemia- See above. -     spironolactone (ALDACTONE) 50 MG tablet; Take 1 tablet (50 mg total) by mouth daily.   I have discontinued Misty B. Haseley's ACCRUFeR, triamterene-hydrochlorothiazide, and clobetasol ointment. I am also having her start on spironolactone. Additionally, I am having her maintain her Lo Loestrin Fe, Prenatal Vit-Fe Fumarate-FA (PRENATAL VITAMIN PO), Cholecalciferol, and thiamine.  Meds ordered this encounter  Medications  . spironolactone (ALDACTONE) 50  MG tablet    Sig: Take 1 tablet (50 mg total) by mouth daily.    Dispense:  90 tablet    Refill:  1     Follow-up: Return in about 3 months (around 07/28/2021).  Scarlette Calico, MD

## 2021-04-28 ENCOUNTER — Telehealth: Payer: Self-pay | Admitting: *Deleted

## 2021-04-28 NOTE — Telephone Encounter (Signed)
Per referral 04/27/21 Dr. Ronnald Ramp - called and lvm of upcoming appointments - mailed calendar with welcome packet

## 2021-04-29 ENCOUNTER — Ambulatory Visit
Admission: RE | Admit: 2021-04-29 | Discharge: 2021-04-29 | Disposition: A | Discharge status: Home / Self Care | Payer: No Typology Code available for payment source | Source: Ambulatory Visit | Attending: Internal Medicine | Admitting: Internal Medicine

## 2021-04-29 DIAGNOSIS — R7989 Other specified abnormal findings of blood chemistry: Secondary | ICD-10-CM

## 2021-05-01 DIAGNOSIS — K7581 Nonalcoholic steatohepatitis (NASH): Secondary | ICD-10-CM | POA: Insufficient documentation

## 2021-05-04 ENCOUNTER — Encounter: Payer: Self-pay | Admitting: Internal Medicine

## 2021-05-16 ENCOUNTER — Encounter: Payer: Self-pay | Admitting: Internal Medicine

## 2021-05-24 ENCOUNTER — Other Ambulatory Visit: Payer: Self-pay | Admitting: Family

## 2021-05-24 DIAGNOSIS — D649 Anemia, unspecified: Secondary | ICD-10-CM

## 2021-05-25 ENCOUNTER — Encounter: Payer: Self-pay | Admitting: Family

## 2021-05-25 ENCOUNTER — Other Ambulatory Visit: Payer: Self-pay

## 2021-05-25 ENCOUNTER — Inpatient Hospital Stay (HOSPITAL_BASED_OUTPATIENT_CLINIC_OR_DEPARTMENT_OTHER): Payer: No Typology Code available for payment source | Admitting: Family

## 2021-05-25 ENCOUNTER — Inpatient Hospital Stay: Payer: No Typology Code available for payment source | Attending: Family

## 2021-05-25 VITALS — BP 117/88 | HR 118 | Temp 99.1°F | Ht 60.0 in | Wt 247.4 lb

## 2021-05-25 DIAGNOSIS — D649 Anemia, unspecified: Secondary | ICD-10-CM

## 2021-05-25 DIAGNOSIS — Z9884 Bariatric surgery status: Secondary | ICD-10-CM | POA: Insufficient documentation

## 2021-05-25 DIAGNOSIS — K909 Intestinal malabsorption, unspecified: Secondary | ICD-10-CM

## 2021-05-25 DIAGNOSIS — D509 Iron deficiency anemia, unspecified: Secondary | ICD-10-CM | POA: Insufficient documentation

## 2021-05-25 DIAGNOSIS — D508 Other iron deficiency anemias: Secondary | ICD-10-CM

## 2021-05-25 LAB — CMP (CANCER CENTER ONLY)
ALT: 25 U/L (ref 0–44)
AST: 20 U/L (ref 15–41)
Albumin: 3.8 g/dL (ref 3.5–5.0)
Alkaline Phosphatase: 123 U/L (ref 38–126)
Anion gap: 10 (ref 5–15)
BUN: 15 mg/dL (ref 6–20)
CO2: 25 mmol/L (ref 22–32)
Calcium: 9.3 mg/dL (ref 8.9–10.3)
Chloride: 104 mmol/L (ref 98–111)
Creatinine: 1.06 mg/dL — ABNORMAL HIGH (ref 0.44–1.00)
GFR, Estimated: >60 mL/min (ref >60)
Glucose, Bld: 97 mg/dL (ref 70–99)
Potassium: 3.2 mmol/L — ABNORMAL LOW (ref 3.5–5.1)
Sodium: 139 mmol/L (ref 135–145)
Total Bilirubin: 0.3 mg/dL (ref 0.3–1.2)
Total Protein: 6.9 g/dL (ref 6.5–8.1)

## 2021-05-25 LAB — CBC WITH DIFFERENTIAL (CANCER CENTER ONLY)
Abs Immature Granulocytes: 0.02 10*3/uL (ref 0.00–0.07)
Basophils Absolute: 0 10*3/uL (ref 0.0–0.1)
Basophils Relative: 0 %
Eosinophils Absolute: 0 10*3/uL (ref 0.0–0.5)
Eosinophils Relative: 1 %
HCT: 36.4 % (ref 36.0–46.0)
Hemoglobin: 11.9 g/dL — ABNORMAL LOW (ref 12.0–15.0)
Immature Granulocytes: 0 %
Lymphocytes Relative: 26 %
Lymphs Abs: 1.5 10*3/uL (ref 0.7–4.0)
MCH: 25.6 pg — ABNORMAL LOW (ref 26.0–34.0)
MCHC: 32.7 g/dL (ref 30.0–36.0)
MCV: 78.4 fL — ABNORMAL LOW (ref 80.0–100.0)
Monocytes Absolute: 0.6 10*3/uL (ref 0.1–1.0)
Monocytes Relative: 11 %
Neutro Abs: 3.5 10*3/uL (ref 1.7–7.7)
Neutrophils Relative %: 62 %
Platelet Count: 392 10*3/uL (ref 150–400)
RBC: 4.64 MIL/uL (ref 3.87–5.11)
RDW: 14.5 % (ref 11.5–15.5)
WBC Count: 5.6 10*3/uL (ref 4.0–10.5)
nRBC: 0 % (ref 0.0–0.2)

## 2021-05-25 LAB — RETICULOCYTES
Immature Retic Fract: 18.7 % — ABNORMAL HIGH (ref 2.3–15.9)
RBC.: 4.66 MIL/uL (ref 3.87–5.11)
Retic Count, Absolute: 74.6 10*3/uL (ref 19.0–186.0)
Retic Ct Pct: 1.6 % (ref 0.4–3.1)

## 2021-05-25 LAB — SAVE SMEAR(SSMR), FOR PROVIDER SLIDE REVIEW

## 2021-05-25 LAB — LACTATE DEHYDROGENASE: LDH: 176 U/L (ref 98–192)

## 2021-05-25 NOTE — Progress Notes (Signed)
Hematology/Oncology Consultation   Name: Peggy [name]      MRN: [id number]    Location: Room/bed info not found  Date: 05/25/2021 Time:2:42 PM   REFERRING PHYSICIAN: Scarlette Calico, MD  REASON FOR CONSULT: Iron deficiency anemia secondary to inadequate dietary intake    DIAGNOSIS: Iron deficiency anemia secondary to malabsorption, Duodenal Switch bariatric surgery 2005  HISTORY OF PRESENT ILLNESS: Ms. Peggy [name] is a very pleasant 50 yo African American female with history of iron deficiency anemia secondary to malabsorption. She had the duodenal switch bariatric surgery in 2005 and developed severe iron deficiency anemia by 2013 requiring IV iron infusions. She tolerated this well.  She is now on an oral iron supplement and not having any benefit.  Hgb  She is symptomatic with fatigue, dizziness, SOB, with exertion, palpitations (history of tachycardia followed by PCP), chills and easy bruising.   11.9, MCV 78, WBC count 5.6 and platelets 392.  She had to stop her birth control recently due to HTN. Her cycle returned and has been regular, lasting 5 days with normal flow.  No other blood loss noted. No petechiae.  She had a colonoscopy in 2013 to rule out GI blood loss as a cause for IDA. This was negative and she is due again in 2023.  She has had multiple surgeries in the past including skin remove from her arms and a cholecystectomy without any complications.  No history of sickle cell disease or trait.  Her sister also has history of anemia.  No personal or known familial history of cancer.  No fever, n/v, cough, rash, chest pain, abdominal pain or changes in bowel or bladder habits.  She has positional numbness and her hands that resolves with changing position. No swelling or tenderness in her extremities.  No falls or syncope.  No smoking or ETOH use.  She has not had her sense of taste or smell since having Covid in 2020.  She states that she still eats small meals throughout the day  and enjoys snacking. She does feel that she is staying well hydrated. Her weight was originally 385 lbs. Post surgery her lowest weight was 208 lbs and she is now stable at 247 lbs.  She works for [employer] with medical benefits. She works from home.   ROS: All other 10 point review of systems is negative.   PAST MEDICAL HISTORY:   Past Medical History:  Diagnosis Date   Depression    Hypertension    Iron deficiency anemia, unspecified 11/21/2012   Perirectal abscess    UTI (urinary tract infection)     ALLERGIES: Allergies  Allergen Reactions   Norco [Hydrocodone-Acetaminophen] Nausea And Vomiting      MEDICATIONS:  Current Outpatient Medications on File Prior to Visit  Medication Sig Dispense Refill   Cholecalciferol 1.25 MG (50000 UT) capsule Take 1 capsule (50,000 Units total) by mouth once a week. 12 capsule 1   LO LOESTRIN FE 1 MG-10 MCG / 10 MCG tablet Take 1 tablet by mouth daily.     Prenatal Vit-Fe Fumarate-FA (PRENATAL VITAMIN PO) Take by mouth.     spironolactone (ALDACTONE) 50 MG tablet Take 1 tablet (50 mg total) by mouth daily. 90 tablet 1   thiamine (VITAMIN B-1) 50 MG tablet Take 1 tablet (50 mg total) by mouth daily. 90 tablet 1   No current facility-administered medications on file prior to visit.     PAST SURGICAL HISTORY Past Surgical History:  Procedure Laterality Date  CYSTECTOMY  2008   GASTRIC BYPASS     INCISE AND DRAIN ABCESS     perirectal abscess    FAMILY HISTORY: Family History  Problem Relation Age of Onset   Hyperlipidemia Mother    Hypertension Mother    Kidney disease Mother    COPD Mother    Hypertension Maternal Grandmother    Stroke Maternal Grandmother    Depression Sister    Hearing loss Sister     SOCIAL HISTORY:  reports that she has never smoked. She has never used smokeless tobacco. She reports that she does not drink alcohol and does not use drugs.  PERFORMANCE STATUS: The patient's performance status  is 1 - Symptomatic but completely ambulatory  PHYSICAL EXAM: Most Recent Vital Signs: There were no vitals taken for this visit. BP 117/88 (BP Location: Right Wrist, Patient Position: Sitting)   Pulse (!) 118   Temp 99.1 F (37.3 C) (Oral)   Ht 5' (1.524 m)   Wt 247 lb 6.4 oz (112.2 kg)   SpO2 99%   BMI 48.32 kg/m   General Appearance:    Alert, cooperative, no distress, appears stated age  Head:    Normocephalic, without obvious abnormality, atraumatic  Eyes:    PERRL, conjunctiva/corneas clear, EOM's intact, fundi    benign, both eyes        Throat:   Lips, mucosa, and tongue normal; teeth and gums normal  Neck:   Supple, symmetrical, trachea midline, no adenopathy;    thyroid:  no enlargement/tenderness/nodules; no carotid   bruit or JVD  Back:     Symmetric, no curvature, ROM normal, no CVA tenderness  Lungs:     Clear to auscultation bilaterally, respirations unlabored  Chest Wall:    No tenderness or deformity   Heart:    Regular rate and rhythm, S1 and S2 normal, no murmur, rub   or gallop     Abdomen:     Soft, non-tender, bowel sounds active all four quadrants,    no masses, no organomegaly        Extremities:   Extremities normal, atraumatic, no cyanosis or edema  Pulses:   2+ and symmetric all extremities  Skin:   Skin color, texture, turgor normal, no rashes or lesions  Lymph nodes:   Cervical, supraclavicular, and axillary nodes normal  Neurologic:   CNII-XII intact, normal strength, sensation and reflexes    throughout    LABORATORY DATA:  Results for orders placed or performed in visit on 05/25/21 (from the past 48 hour(s))  CBC with Differential ([facility] Only)     Status: Abnormal (Preliminary result)   Collection Time: 05/25/21  2:14 PM  Result Value Ref Range   WBC Count 5.6 4.0 - 10.5 K/uL   RBC 4.64 3.87 - 5.11 MIL/uL   Hemoglobin 11.9 (L) 12.0 - 15.0 g/dL   HCT 36.4 36.0 - 46.0 %   MCV 78.4 (L) 80.0 - 100.0 fL   MCH 25.6 (L) 26.0 - 34.0 pg    MCHC 32.7 30.0 - 36.0 g/dL   RDW 14.5 11.5 - 15.5 %   Platelet Count 392 150 - 400 K/uL   nRBC 0.0 0.0 - 0.2 %    Comment: Performed at [facility] at [facility], [address] [place], [place] [address]   Neutrophils Relative % PENDING %   Neutro Abs PENDING 1.7 - 7.7 K/uL   Band Neutrophils PENDING %   Lymphocytes Relative PENDING %  Lymphs Abs PENDING 0.7 - 4.0 K/uL   Monocytes Relative PENDING %   Monocytes Absolute PENDING 0.1 - 1.0 K/uL   Eosinophils Relative PENDING %   Eosinophils Absolute PENDING 0.0 - 0.5 K/uL   Basophils Relative PENDING %   Basophils Absolute PENDING 0.0 - 0.1 K/uL   WBC Morphology PENDING    RBC Morphology PENDING    Smear Review PENDING    Other PENDING %   nRBC PENDING 0 /100 WBC   Metamyelocytes Relative PENDING %   Myelocytes PENDING %   Promyelocytes Relative PENDING %   Blasts PENDING %   Immature Granulocytes PENDING %   Abs Immature Granulocytes PENDING 0.00 - 0.07 K/uL  Reticulocytes     Status: Abnormal   Collection Time: 05/25/21  2:27 PM  Result Value Ref Range   Retic Ct Pct 1.6 0.4 - 3.1 %   RBC. 4.66 3.87 - 5.11 MIL/uL   Retic Count, Absolute 74.6 19.0 - 186.0 K/uL   Immature Retic Fract 18.7 (H) 2.3 - 15.9 %    Comment: Performed at [facility] at [facility], [address], [place], [place] [address]      RADIOGRAPHY: No results found.     PATHOLOGY: None  ASSESSMENT/PLAN: Ms. Soria is a very pleasant 50 yo African American female with history of iron deficiency anemia secondary to malabsorption after having the duodenal switch bariatric surgery in 2005.  Iron studies are pending. We will replace if needed.  Follow-up in 8 weeks.   All questions were answered. The patient knows to call the clinic with any problems, questions or concerns. We can certainly see the patient much sooner if necessary.   Laverna Peace, NP

## 2021-05-25 NOTE — Addendum Note (Signed)
Addended by: Eliezer Bottom on: 05/25/2021 03:57 PM   Modules accepted: Level of Service

## 2021-05-26 ENCOUNTER — Telehealth: Payer: Self-pay | Admitting: *Deleted

## 2021-05-26 LAB — FERRITIN: Ferritin: 6 ng/mL — ABNORMAL LOW (ref 11–307)

## 2021-05-26 LAB — IRON AND TIBC
Iron: 40 ug/dL — ABNORMAL LOW (ref 41–142)
Saturation Ratios: 10 % — ABNORMAL LOW (ref 21–57)
TIBC: 406 ug/dL (ref 236–444)
UIBC: 366 ug/dL (ref 120–384)

## 2021-05-26 LAB — ERYTHROPOIETIN: Erythropoietin: 23.3 m[IU]/mL — ABNORMAL HIGH (ref 2.6–18.5)

## 2021-05-26 NOTE — Telephone Encounter (Signed)
Per 05/25/21 los called and gave upcoming appointments - mailed calendar

## 2021-05-27 ENCOUNTER — Other Ambulatory Visit: Payer: Self-pay | Admitting: Family

## 2021-05-30 ENCOUNTER — Ambulatory Visit: Payer: No Typology Code available for payment source

## 2021-05-31 ENCOUNTER — Other Ambulatory Visit: Payer: Self-pay

## 2021-05-31 ENCOUNTER — Inpatient Hospital Stay: Payer: No Typology Code available for payment source

## 2021-05-31 VITALS — BP 102/50 | HR 86 | Temp 98.9°F | Resp 17

## 2021-05-31 DIAGNOSIS — D509 Iron deficiency anemia, unspecified: Secondary | ICD-10-CM | POA: Diagnosis not present

## 2021-05-31 DIAGNOSIS — D508 Other iron deficiency anemias: Secondary | ICD-10-CM

## 2021-05-31 MED ORDER — NA FERRIC GLUC CPLX IN SUCROSE 12.5 MG/ML IV SOLN
125.0000 mg | Freq: Once | INTRAVENOUS | Status: AC
  Administered 2021-05-31: 125 mg via INTRAVENOUS
  Filled 2021-05-31: qty 125

## 2021-05-31 MED ORDER — SODIUM CHLORIDE 0.9 % IV SOLN
Freq: Once | INTRAVENOUS | Status: AC
  Filled 2021-05-31: qty 250

## 2021-05-31 NOTE — Patient Instructions (Signed)
[facility]  Discharge Instructions: Thank you for choosing [facility] to provide your oncology and hematology care.   If you have a lab appointment with the Cancer Center, please go directly to the Cancer Center and check in at the registration area.  Wear comfortable clothing and clothing appropriate for easy access to any Portacath or PICC line.   We strive to give you quality time with your provider. You may need to reschedule your appointment if you arrive late (15 or more minutes).  Arriving late affects you and other patients whose appointments are after yours.  Also, if you miss three or more appointments without notifying the office, you may be dismissed from the clinic at the provider's discretion.      For prescription refill requests, have your pharmacy contact our office and allow 72 hours for refills to be completed.    Today you received the following chemotherapy and/or immunotherapy agents Ferrlicit.   To help prevent nausea and vomiting after your treatment, we encourage you to take your nausea medication as directed.  BELOW ARE SYMPTOMS THAT SHOULD BE REPORTED IMMEDIATELY: *FEVER GREATER THAN 100.4 F (38 C) OR HIGHER *CHILLS OR SWEATING *NAUSEA AND VOMITING THAT IS NOT CONTROLLED WITH YOUR NAUSEA MEDICATION *UNUSUAL SHORTNESS OF BREATH *UNUSUAL BRUISING OR BLEEDING *URINARY PROBLEMS (pain or burning when urinating, or frequent urination) *BOWEL PROBLEMS (unusual diarrhea, constipation, pain near the anus) TENDERNESS IN MOUTH AND THROAT WITH OR WITHOUT PRESENCE OF ULCERS (sore throat, sores in mouth, or a toothache) UNUSUAL RASH, SWELLING OR PAIN  UNUSUAL VAGINAL DISCHARGE OR ITCHING   Items with * indicate a potential emergency and should be followed up as soon as possible or go to the Emergency Department if any problems should occur.  Please show the CHEMOTHERAPY ALERT CARD or IMMUNOTHERAPY ALERT CARD at check-in to the  Emergency Department and triage nurse. Should you have questions after your visit or need to cancel or reschedule your appointment, please contact [facility]  [phone] and follow the prompts.  Office hours are 8:00 a.m. to 4:30 p.m. Monday - Friday. Please note that voicemails left after 4:00 p.m. may not be returned until the following business day.  We are closed weekends and major holidays. You have access to a nurse at all times for urgent questions. Please call the main number to the clinic [phone] and follow the prompts.  For any non-urgent questions, you may also contact your provider using MyChart. We now offer e-Visits for anyone 18 and older to request care online for non-urgent symptoms. For details visit [email or web address].   Also download the MyChart app! Go to the app store, search "MyChart", open the app, select [facility], and log in with your MyChart username and password.  Due to Covid, a mask is required upon entering the hospital/clinic. If you do not have a mask, one will be given to you upon arrival. For doctor visits, patients may have 1 support person aged 18 or older with them. For treatment visits, patients cannot have anyone with them due to current Covid guidelines and our immunocompromised population.  

## 2021-06-07 ENCOUNTER — Ambulatory Visit: Payer: No Typology Code available for payment source

## 2021-06-08 ENCOUNTER — Other Ambulatory Visit: Payer: Self-pay

## 2021-06-08 ENCOUNTER — Inpatient Hospital Stay: Payer: No Typology Code available for payment source

## 2021-06-08 VITALS — BP 93/51 | HR 88 | Temp 99.7°F | Resp 20

## 2021-06-08 DIAGNOSIS — D508 Other iron deficiency anemias: Secondary | ICD-10-CM

## 2021-06-08 DIAGNOSIS — D509 Iron deficiency anemia, unspecified: Secondary | ICD-10-CM | POA: Diagnosis not present

## 2021-06-08 MED ORDER — SODIUM CHLORIDE 0.9 % IV SOLN
Freq: Once | INTRAVENOUS | Status: AC
  Filled 2021-06-08: qty 250

## 2021-06-08 MED ORDER — SODIUM CHLORIDE 0.9 % IV SOLN
125.0000 mg | Freq: Once | INTRAVENOUS | Status: AC
  Administered 2021-06-08: 125 mg via INTRAVENOUS
  Filled 2021-06-08: qty 125

## 2021-07-02 ENCOUNTER — Other Ambulatory Visit: Payer: Self-pay | Admitting: Internal Medicine

## 2021-07-02 DIAGNOSIS — E876 Hypokalemia: Secondary | ICD-10-CM

## 2021-07-02 DIAGNOSIS — I10 Essential (primary) hypertension: Secondary | ICD-10-CM

## 2021-07-02 MED ORDER — TRIAMTERENE-HCTZ 37.5-25 MG PO CAPS: ORAL_CAPSULE | ORAL | 0 refills | Status: AC

## 2021-07-25 ENCOUNTER — Other Ambulatory Visit: Payer: Self-pay

## 2021-07-25 ENCOUNTER — Encounter: Payer: Self-pay | Admitting: Family

## 2021-07-25 ENCOUNTER — Telehealth: Payer: Self-pay | Admitting: *Deleted

## 2021-07-25 ENCOUNTER — Inpatient Hospital Stay: Payer: No Typology Code available for payment source | Attending: Family

## 2021-07-25 ENCOUNTER — Inpatient Hospital Stay (HOSPITAL_BASED_OUTPATIENT_CLINIC_OR_DEPARTMENT_OTHER): Payer: No Typology Code available for payment source | Admitting: Family

## 2021-07-25 VITALS — BP 137/68 | HR 83 | Temp 98.7°F | Resp 18 | Ht 60.0 in | Wt 249.0 lb

## 2021-07-25 DIAGNOSIS — Z79899 Other long term (current) drug therapy: Secondary | ICD-10-CM | POA: Diagnosis not present

## 2021-07-25 DIAGNOSIS — Z9884 Bariatric surgery status: Secondary | ICD-10-CM | POA: Insufficient documentation

## 2021-07-25 DIAGNOSIS — R202 Paresthesia of skin: Secondary | ICD-10-CM | POA: Insufficient documentation

## 2021-07-25 DIAGNOSIS — D508 Other iron deficiency anemias: Secondary | ICD-10-CM | POA: Insufficient documentation

## 2021-07-25 DIAGNOSIS — Z885 Allergy status to narcotic agent status: Secondary | ICD-10-CM | POA: Diagnosis not present

## 2021-07-25 DIAGNOSIS — R200 Anesthesia of skin: Secondary | ICD-10-CM | POA: Diagnosis not present

## 2021-07-25 DIAGNOSIS — K909 Intestinal malabsorption, unspecified: Secondary | ICD-10-CM

## 2021-07-25 LAB — IRON AND TIBC
Iron: 63 ug/dL (ref 41–142)
Saturation Ratios: 18 % — ABNORMAL LOW (ref 21–57)
TIBC: 348 ug/dL (ref 236–444)
UIBC: 284 ug/dL (ref 120–384)

## 2021-07-25 LAB — CBC WITH DIFFERENTIAL (CANCER CENTER ONLY)
Abs Immature Granulocytes: 0.02 10*3/uL (ref 0.00–0.07)
Basophils Absolute: 0 10*3/uL (ref 0.0–0.1)
Basophils Relative: 0 %
Eosinophils Absolute: 0.1 10*3/uL (ref 0.0–0.5)
Eosinophils Relative: 2 %
HCT: 37.3 % (ref 36.0–46.0)
Hemoglobin: 12.2 g/dL (ref 12.0–15.0)
Immature Granulocytes: 0 %
Lymphocytes Relative: 38 %
Lymphs Abs: 1.7 10*3/uL (ref 0.7–4.0)
MCH: 26.3 pg (ref 26.0–34.0)
MCHC: 32.7 g/dL (ref 30.0–36.0)
MCV: 80.6 fL (ref 80.0–100.0)
Monocytes Absolute: 0.4 10*3/uL (ref 0.1–1.0)
Monocytes Relative: 9 %
Neutro Abs: 2.3 10*3/uL (ref 1.7–7.7)
Neutrophils Relative %: 51 %
Platelet Count: 340 10*3/uL (ref 150–400)
RBC: 4.63 MIL/uL (ref 3.87–5.11)
RDW: 15.9 % — ABNORMAL HIGH (ref 11.5–15.5)
WBC Count: 4.6 10*3/uL (ref 4.0–10.5)
nRBC: 0 % (ref 0.0–0.2)

## 2021-07-25 LAB — RETICULOCYTES
Immature Retic Fract: 15.7 % (ref 2.3–15.9)
RBC.: 4.59 MIL/uL (ref 3.87–5.11)
Retic Count, Absolute: 81.7 10*3/uL (ref 19.0–186.0)
Retic Ct Pct: 1.8 % (ref 0.4–3.1)

## 2021-07-25 LAB — FERRITIN: Ferritin: 5 ng/mL — ABNORMAL LOW (ref 11–307)

## 2021-07-25 NOTE — Progress Notes (Signed)
Hematology and Oncology Follow Up Visit  Peggy [name] [id number] [birth year] 50 y.o. 07/25/2021   Principle Diagnosis:  Iron deficiency anemia secondary to malabsorption, Duodenal Switch bariatric surgery 2005  Current Therapy:   IV iron as indicated   Interim History:  Peggy [name] is here today for follow-up. She is doing quite well and has no complaints at this time.  She states that she tolerated IV iron nicely and her symptoms have resolved.  No fatigue.  She states that her cycle is irregular not occurring every month. No other blood loss noted.  She has had the occasional hot flash but more often has night sweats.  No fever, chills, n/v, cough, rash, dizziness, SOB, chest pain, palpitations, abdominal pain or changes in bowel or bladder habits.  No swelling or tenderness in her extremities. She is numbness and tingling in her hands when sleeping due to position. This resolves once she moves.  No falls or syncope to report.  She has maintained a good appetite and is staying well hydrated. Her weight is stable at 249 lbs.   ECOG Performance Status: 1 - Symptomatic but completely ambulatory  Medications:  Allergies as of 07/25/2021       Reactions   Norco [hydrocodone-acetaminophen] Nausea And Vomiting        Medication List        Accurate as of July 25, 2021  8:50 AM. If you have any questions, ask your nurse or doctor.          Cholecalciferol 1.25 MG (50000 UT) capsule Take 1 capsule (50,000 Units total) by mouth once a week.   Lo Loestrin Fe 1 MG-10 MCG / 10 MCG tablet Generic drug: Norethindrone-Ethinyl Estradiol-Fe Biphas Take 1 tablet by mouth daily.   PRENATAL VITAMIN PO Take by mouth.   thiamine 50 MG tablet Commonly known as: VITAMIN B-1 Take 1 tablet (50 mg total) by mouth daily.   triamterene-hydrochlorothiazide 37.5-25 MG capsule Commonly known as: DYAZIDE TAKE 1 EACH (1 CAPSULE TOTAL) BY MOUTH DAILY.        Allergies:  Allergies   Allergen Reactions   Norco [Hydrocodone-Acetaminophen] Nausea And Vomiting    Past Medical History, Surgical history, Social history, and Family History were reviewed and updated.  Review of Systems: All other 10 point review of systems is negative.   Physical Exam:  vitals were not taken for this visit.   Wt Readings from Last 3 Encounters:  05/25/21 247 lb 6.4 oz (112.2 kg)  04/27/21 244 lb (110.7 kg)  04/04/21 253 lb (114.8 kg)    Ocular: Sclerae unicteric, pupils equal, round and reactive to light Ear-nose-throat: Oropharynx clear, dentition fair Lymphatic: No cervical or supraclavicular adenopathy Lungs no rales or rhonchi, good excursion bilaterally Heart regular rate and rhythm, no murmur appreciated Abd soft, nontender, positive bowel sounds MSK no focal spinal tenderness, no joint edema Neuro: non-focal, well-oriented, appropriate affect Breasts: Deferred   Lab Results  Component Value Date   WBC 4.6 07/25/2021   HGB 12.2 07/25/2021   HCT 37.3 07/25/2021   MCV 80.6 07/25/2021   PLT 340 07/25/2021   Lab Results  Component Value Date   FERRITIN 6 (L) 05/25/2021   IRON 40 (L) 05/25/2021   TIBC 406 05/25/2021   UIBC 366 05/25/2021   IRONPCTSAT 10 (L) 05/25/2021   Lab Results  Component Value Date   RETICCTPCT 1.8 07/25/2021   RBC 4.63 07/25/2021   RBC 4.59 07/25/2021   No results found for: KPAFRELGTCHN, LAMBDASER,  KAPLAMBRATIO No results found for: IGGSERUM, IGA, IGMSERUM No results found for: Odetta Pink, SPEI   Chemistry      Component Value Date/Time   NA 139 05/25/2021 1414   NA 142 11/21/2012 1245   K 3.2 (L) 05/25/2021 1414   K 3.7 11/21/2012 1245   CL 104 05/25/2021 1414   CL 109 (H) 11/21/2012 1245   CO2 25 05/25/2021 1414   CO2 26 11/21/2012 1245   BUN 15 05/25/2021 1414   BUN 10.0 11/21/2012 1245   CREATININE 1.06 (H) 05/25/2021 1414   CREATININE 0.9 11/21/2012 1245       Component Value Date/Time   CALCIUM 9.3 05/25/2021 1414   CALCIUM 8.7 11/21/2012 1245   ALKPHOS 123 05/25/2021 1414   ALKPHOS 154 (H) 11/21/2012 1245   AST 20 05/25/2021 1414   AST 18 11/21/2012 1245   ALT 25 05/25/2021 1414   ALT 22 11/21/2012 1245   BILITOT 0.3 05/25/2021 1414   BILITOT 0.26 11/21/2012 1245       Impression and Plan: Peggy [name] is a very pleasant 50 yo African American female with history of iron deficiency anemia secondary to malabsorption after having the duodenal switch bariatric surgery in 2005.  Iron studies are pending. Due to cost of infusions she will now switch over to a sublingual over the counter iron supplement.  We will see her again in 3 months for follow-up.  She can contact our office with any questions or concerns.   Laverna Peace, NP 8/15/20228:50 AM

## 2021-07-25 NOTE — Telephone Encounter (Signed)
Per 07/25/21 los - gave upcoming appointments - requested call back to confirm - mailed calendar

## 2021-07-31 ENCOUNTER — Other Ambulatory Visit: Payer: Self-pay | Admitting: Internal Medicine

## 2021-07-31 DIAGNOSIS — Z9884 Bariatric surgery status: Secondary | ICD-10-CM

## 2021-07-31 DIAGNOSIS — E559 Vitamin D deficiency, unspecified: Secondary | ICD-10-CM

## 2021-10-22 ENCOUNTER — Other Ambulatory Visit: Payer: Self-pay | Admitting: Internal Medicine

## 2021-10-22 DIAGNOSIS — E559 Vitamin D deficiency, unspecified: Secondary | ICD-10-CM

## 2021-10-22 DIAGNOSIS — Z9884 Bariatric surgery status: Secondary | ICD-10-CM

## 2021-10-25 ENCOUNTER — Ambulatory Visit: Payer: No Typology Code available for payment source | Admitting: Family

## 2021-10-25 ENCOUNTER — Other Ambulatory Visit: Payer: No Typology Code available for payment source

## 2021-10-29 ENCOUNTER — Other Ambulatory Visit: Payer: Self-pay | Admitting: Internal Medicine

## 2021-10-29 DIAGNOSIS — E519 Thiamine deficiency, unspecified: Secondary | ICD-10-CM

## 2021-11-25 ENCOUNTER — Other Ambulatory Visit: Payer: Self-pay

## 2021-11-25 ENCOUNTER — Inpatient Hospital Stay (HOSPITAL_BASED_OUTPATIENT_CLINIC_OR_DEPARTMENT_OTHER): Payer: No Typology Code available for payment source | Admitting: Family

## 2021-11-25 ENCOUNTER — Encounter: Payer: Self-pay | Admitting: Family

## 2021-11-25 ENCOUNTER — Inpatient Hospital Stay: Payer: No Typology Code available for payment source | Attending: Hematology & Oncology

## 2021-11-25 VITALS — BP 115/87 | HR 80 | Temp 98.5°F | Resp 16

## 2021-11-25 DIAGNOSIS — D508 Other iron deficiency anemias: Secondary | ICD-10-CM | POA: Diagnosis not present

## 2021-11-25 DIAGNOSIS — K909 Intestinal malabsorption, unspecified: Secondary | ICD-10-CM | POA: Insufficient documentation

## 2021-11-25 DIAGNOSIS — D509 Iron deficiency anemia, unspecified: Secondary | ICD-10-CM | POA: Diagnosis not present

## 2021-11-25 DIAGNOSIS — Z9884 Bariatric surgery status: Secondary | ICD-10-CM | POA: Insufficient documentation

## 2021-11-25 LAB — CBC WITH DIFFERENTIAL (CANCER CENTER ONLY)
Abs Immature Granulocytes: 0.02 10*3/uL (ref 0.00–0.07)
Basophils Absolute: 0 10*3/uL (ref 0.0–0.1)
Basophils Relative: 0 %
Eosinophils Absolute: 0.1 10*3/uL (ref 0.0–0.5)
Eosinophils Relative: 3 %
HCT: 37.1 % (ref 36.0–46.0)
Hemoglobin: 12.3 g/dL (ref 12.0–15.0)
Immature Granulocytes: 0 %
Lymphocytes Relative: 41 %
Lymphs Abs: 2.1 10*3/uL (ref 0.7–4.0)
MCH: 27.3 pg (ref 26.0–34.0)
MCHC: 33.2 g/dL (ref 30.0–36.0)
MCV: 82.4 fL (ref 80.0–100.0)
Monocytes Absolute: 0.6 10*3/uL (ref 0.1–1.0)
Monocytes Relative: 11 %
Neutro Abs: 2.3 10*3/uL (ref 1.7–7.7)
Neutrophils Relative %: 45 %
Platelet Count: 350 10*3/uL (ref 150–400)
RBC: 4.5 MIL/uL (ref 3.87–5.11)
RDW: 13.9 % (ref 11.5–15.5)
WBC Count: 5 10*3/uL (ref 4.0–10.5)
nRBC: 0 % (ref 0.0–0.2)

## 2021-11-25 LAB — RETICULOCYTES
Immature Retic Fract: 12 % (ref 2.3–15.9)
RBC.: 4.47 MIL/uL (ref 3.87–5.11)
Retic Count, Absolute: 73.3 10*3/uL (ref 19.0–186.0)
Retic Ct Pct: 1.6 % (ref 0.4–3.1)

## 2021-11-25 NOTE — Progress Notes (Signed)
Hematology and Oncology Follow Up Visit  Peggy [name] [id number] March 26, 1971 50 y.o. 11/25/2021   Principle Diagnosis:  Iron deficiency anemia secondary to malabsorption, Duodenal Switch bariatric surgery 2005   Current Therapy:        Sublingual iron daily due to cost of IV iron   Interim History:  Peggy [name] is here today for follow-up. She states that she is doing quite well on sublingual iron.  She denies fatigue.  Her cycle is on and can be heavy at times.  No other blood loss noted. No bruising or petechiae.  No fever, chills, n/v, cough, rash, dizziness, SOB, chest pain, palpitations, abdominal pain or changes in bowel or bladder habits.  No swelling, tenderness, numbness or tingling in her extremities at this time.  No falls or syncope.  She has maintained a good appetite and is staying well hydrated. Her weight is stable at 252 lbs.   ECOG Performance Status: 1 - Symptomatic but completely ambulatory  Medications:  Allergies as of 11/25/2021       Reactions   Norco [hydrocodone-acetaminophen] Nausea And Vomiting        Medication List        Accurate as of November 25, 2021  1:58 PM. If you have any questions, ask your nurse or doctor.          D3-50 1.25 MG (50000 UT) capsule Generic drug: Cholecalciferol TAKE 1 CAPSULE BY MOUTH ONE TIME PER WEEK   PRENATAL VITAMIN PO Take by mouth.   thiamine 50 MG tablet Commonly known as: VITAMIN B-1 TAKE 1 TABLET BY MOUTH EVERY DAY   triamterene-hydrochlorothiazide 37.5-25 MG capsule Commonly known as: DYAZIDE TAKE 1 EACH (1 CAPSULE TOTAL) BY MOUTH DAILY.        Allergies:  Allergies  Allergen Reactions   Norco [Hydrocodone-Acetaminophen] Nausea And Vomiting    Past Medical History, Surgical history, Social history, and Family History were reviewed and updated.  Review of Systems: All other 10 point review of systems is negative.   Physical Exam:  oral temperature is 98.5 F (36.9 C). Her  blood pressure is 115/87 and her pulse is 80. Her respiration is 16.   Wt Readings from Last 3 Encounters:  07/25/21 249 lb (112.9 kg)  05/25/21 247 lb 6.4 oz (112.2 kg)  04/27/21 244 lb (110.7 kg)    Ocular: Sclerae unicteric, pupils equal, round and reactive to light Ear-nose-throat: Oropharynx clear, dentition fair Lymphatic: No cervical or supraclavicular adenopathy Lungs no rales or rhonchi, good excursion bilaterally Heart regular rate and rhythm, no murmur appreciated Abd soft, nontender, positive bowel sounds MSK no focal spinal tenderness, no joint edema Neuro: non-focal, well-oriented, appropriate affect Breasts: Deferred   Lab Results  Component Value Date   WBC 5.0 11/25/2021   HGB 12.3 11/25/2021   HCT 37.1 11/25/2021   MCV 82.4 11/25/2021   PLT 350 11/25/2021   Lab Results  Component Value Date   FERRITIN 5 (L) 07/25/2021   IRON 63 07/25/2021   TIBC 348 07/25/2021   UIBC 284 07/25/2021   IRONPCTSAT 18 (L) 07/25/2021   Lab Results  Component Value Date   RETICCTPCT 1.6 11/25/2021   RBC 4.50 11/25/2021   RBC 4.47 11/25/2021   No results found for: KPAFRELGTCHN, LAMBDASER, KAPLAMBRATIO No results found for: IGGSERUM, IGA, IGMSERUM No results found for: TOTALPROTELP, ALBUMINELP, A1GS, A2GS, BETS, BETA2SER, GAMS, MSPIKE, SPEI   Chemistry      Component Value Date/Time   NA 139 05/25/2021 1414  NA 142 11/21/2012 1245   K 3.2 (L) 05/25/2021 1414   K 3.7 11/21/2012 1245   CL 104 05/25/2021 1414   CL 109 (H) 11/21/2012 1245   CO2 25 05/25/2021 1414   CO2 26 11/21/2012 1245   BUN 15 05/25/2021 1414   BUN 10.0 11/21/2012 1245   CREATININE 1.06 (H) 05/25/2021 1414   CREATININE 0.9 11/21/2012 1245      Component Value Date/Time   CALCIUM 9.3 05/25/2021 1414   CALCIUM 8.7 11/21/2012 1245   ALKPHOS 123 05/25/2021 1414   ALKPHOS 154 (H) 11/21/2012 1245   AST 20 05/25/2021 1414   AST 18 11/21/2012 1245   ALT 25 05/25/2021 1414   ALT 22 11/21/2012 1245    BILITOT 0.3 05/25/2021 1414   BILITOT 0.26 11/21/2012 1245       Impression and Plan: Peggy [name] is a very pleasant 50 yo African American female with history of iron deficiency anemia secondary to malabsorption after having the duodenal switch bariatric surgery in 2005.  Iron studies are pending. She will continue her sublingual supplement daily.  Follow-up in 3 months.   Peggy Dawson, NP 12/16/20221:58 PM

## 2021-11-28 LAB — FERRITIN: Ferritin: 20 ng/mL (ref 11–307)

## 2021-11-28 LAB — IRON AND TIBC
Iron: 90 ug/dL (ref 41–142)
Saturation Ratios: 28 % (ref 21–57)
TIBC: 323 ug/dL (ref 236–444)
UIBC: 233 ug/dL (ref 120–384)

## 2022-02-17 ENCOUNTER — Telehealth: Payer: Self-pay | Admitting: *Deleted

## 2022-02-17 NOTE — Telephone Encounter (Signed)
Called and lvm of rescheduling appointment from 3/17 to 3/16 with Judson Roch - requested call back to confirm ?

## 2022-02-23 ENCOUNTER — Other Ambulatory Visit: Payer: Self-pay

## 2022-02-23 ENCOUNTER — Inpatient Hospital Stay: Payer: No Typology Code available for payment source | Attending: Hematology & Oncology

## 2022-02-23 ENCOUNTER — Inpatient Hospital Stay (HOSPITAL_BASED_OUTPATIENT_CLINIC_OR_DEPARTMENT_OTHER): Payer: No Typology Code available for payment source | Admitting: Family

## 2022-02-23 ENCOUNTER — Encounter: Payer: Self-pay | Admitting: Family

## 2022-02-23 VITALS — BP 110/69 | HR 82 | Temp 98.0°F | Resp 18 | Ht 60.0 in | Wt 250.0 lb

## 2022-02-23 DIAGNOSIS — Z9884 Bariatric surgery status: Secondary | ICD-10-CM | POA: Insufficient documentation

## 2022-02-23 DIAGNOSIS — D508 Other iron deficiency anemias: Secondary | ICD-10-CM

## 2022-02-23 DIAGNOSIS — K909 Intestinal malabsorption, unspecified: Secondary | ICD-10-CM | POA: Insufficient documentation

## 2022-02-23 DIAGNOSIS — D509 Iron deficiency anemia, unspecified: Secondary | ICD-10-CM | POA: Diagnosis not present

## 2022-02-23 LAB — CBC WITH DIFFERENTIAL (CANCER CENTER ONLY)
Abs Immature Granulocytes: 0.01 10*3/uL (ref 0.00–0.07)
Basophils Absolute: 0 10*3/uL (ref 0.0–0.1)
Basophils Relative: 1 %
Eosinophils Absolute: 0.1 10*3/uL (ref 0.0–0.5)
Eosinophils Relative: 2 %
HCT: 39.8 % (ref 36.0–46.0)
Hemoglobin: 13 g/dL (ref 12.0–15.0)
Immature Granulocytes: 0 %
Lymphocytes Relative: 33 %
Lymphs Abs: 1.5 10*3/uL (ref 0.7–4.0)
MCH: 27.1 pg (ref 26.0–34.0)
MCHC: 32.7 g/dL (ref 30.0–36.0)
MCV: 83.1 fL (ref 80.0–100.0)
Monocytes Absolute: 0.4 10*3/uL (ref 0.1–1.0)
Monocytes Relative: 8 %
Neutro Abs: 2.7 10*3/uL (ref 1.7–7.7)
Neutrophils Relative %: 56 %
Platelet Count: 382 10*3/uL (ref 150–400)
RBC: 4.79 MIL/uL (ref 3.87–5.11)
RDW: 13.9 % (ref 11.5–15.5)
WBC Count: 4.7 10*3/uL (ref 4.0–10.5)
nRBC: 0 % (ref 0.0–0.2)

## 2022-02-23 LAB — FERRITIN: Ferritin: 28 ng/mL (ref 11–307)

## 2022-02-23 LAB — RETICULOCYTES
Immature Retic Fract: 12.5 % (ref 2.3–15.9)
RBC.: 4.66 MIL/uL (ref 3.87–5.11)
Retic Count, Absolute: 76.4 10*3/uL (ref 19.0–186.0)
Retic Ct Pct: 1.6 % (ref 0.4–3.1)

## 2022-02-23 LAB — IRON AND IRON BINDING CAPACITY (CC-WL,HP ONLY)
Iron: 93 ug/dL (ref 28–170)
Saturation Ratios: 28 % (ref 10.4–31.8)
TIBC: 337 ug/dL (ref 250–450)
UIBC: 244 ug/dL (ref 148–442)

## 2022-02-23 NOTE — Progress Notes (Signed)
?Hematology and Oncology Follow Up Visit ? ?Peggy [name] ?[id number] ?[birth year] 51 y.o. ?02/23/2022 ? ? ?Principle Diagnosis:  ?Iron deficiency anemia secondary to malabsorption, Duodenal Switch bariatric surgery 2005 ?  ?Current Therapy:        ?Sublingual iron daily due to cost of IV iron ?  ?Interim History:  Peggy [name] is here today for follow-up. She is doing well and has no complaints at this time.  ?She is tolerating the sublingual liquid iron nicely.  ?Her cycle is regular with normal flow. No other blood loss noted.  ?No bruising or petechiae.  ?No fever, chills, n/v, cough, rash, dizziness, SOB, chest pain, palpitations, abdominal pain or changes in bowel or bladder habits.  ?No swelling, tenderness, numbness or tingling in her extremities.  ?No falls or syncope.  ?She is eating well and hydrating properly throughout the day. Her weight is stable at 250 lbs.  ? ?ECOG Performance Status: 0 - Asymptomatic ? ?Medications:  ?Allergies as of 02/23/2022   ? ?   Reactions  ? Norco [hydrocodone-acetaminophen] Nausea And Vomiting  ? ?  ? ?  ?Medication List  ?  ? ?  ? Accurate as of February 23, 2022 10:01 AM. If you have any questions, ask your nurse or doctor.  ?  ?  ? ?  ? ?STOP taking these medications   ? ?[employer]ue At-Home Covid-19 Test Kit ?Generic drug: COVID-19 At Home Antigen Test ?Stopped by: Lottie Dawson, NP ?  ? ?  ? ?TAKE these medications   ? ?D3-50 1.25 MG (50000 UT) capsule ?Generic drug: Cholecalciferol ?TAKE 1 CAPSULE BY MOUTH ONE TIME PER WEEK ?  ?PRENATAL VITAMIN PO ?Take by mouth. ?  ?thiamine 50 MG tablet ?Commonly known as: VITAMIN B-1 ?TAKE 1 TABLET BY MOUTH EVERY DAY ?  ?triamterene-hydrochlorothiazide 37.5-25 MG capsule ?Commonly known as: DYAZIDE ?TAKE 1 EACH (1 CAPSULE TOTAL) BY MOUTH DAILY. ?  ? ?  ? ? ?Allergies:  ?Allergies  ?Allergen Reactions  ? Norco [Hydrocodone-Acetaminophen] Nausea And Vomiting  ? ? ?Past Medical History, Surgical history, Social history, and Family History were  reviewed and updated. ? ?Review of Systems: ?All other 10 point review of systems is negative.  ? ?Physical Exam: ? height is 5' (1.524 m) and weight is 250 lb (113.4 kg). Her oral temperature is 98 ?F (36.7 ?C). Her blood pressure is 110/69 and her pulse is 82. Her respiration is 18 and oxygen saturation is 100%.  ? ?Wt Readings from Last 3 Encounters:  ?02/23/22 250 lb (113.4 kg)  ?07/25/21 249 lb (112.9 kg)  ?05/25/21 247 lb 6.4 oz (112.2 kg)  ? ? ?Ocular: Sclerae unicteric, pupils equal, round and reactive to light ?Ear-nose-throat: Oropharynx clear, dentition fair ?Lymphatic: No cervical or supraclavicular adenopathy ?Lungs no rales or rhonchi, good excursion bilaterally ?Heart regular rate and rhythm, no murmur appreciated ?Abd soft, nontender, positive bowel sounds ?MSK no focal spinal tenderness, no joint edema ?Neuro: non-focal, well-oriented, appropriate affect ?Breasts: Deferred  ? ?Lab Results  ?Component Value Date  ? WBC 4.7 02/23/2022  ? HGB 13.0 02/23/2022  ? HCT 39.8 02/23/2022  ? MCV 83.1 02/23/2022  ? PLT 382 02/23/2022  ? ?Lab Results  ?Component Value Date  ? FERRITIN 20 11/25/2021  ? IRON 90 11/25/2021  ? TIBC 323 11/25/2021  ? UIBC 233 11/25/2021  ? IRONPCTSAT 28 11/25/2021  ? ?Lab Results  ?Component Value Date  ? RETICCTPCT 1.6 02/23/2022  ? RBC 4.79 02/23/2022  ? ?No results found for:  KPAFRELGTCHN, LAMBDASER, KAPLAMBRATIO ?No results found for: IGGSERUM, IGA, IGMSERUM ?No results found for: TOTALPROTELP, ALBUMINELP, A1GS, A2GS, BETS, BETA2SER, GAMS, MSPIKE, SPEI ?  Chemistry   ?   ?Component Value Date/Time  ? NA 139 05/25/2021 1414  ? NA 142 11/21/2012 1245  ? K 3.2 (L) 05/25/2021 1414  ? K 3.7 11/21/2012 1245  ? CL 104 05/25/2021 1414  ? CL 109 (H) 11/21/2012 1245  ? CO2 25 05/25/2021 1414  ? CO2 26 11/21/2012 1245  ? BUN 15 05/25/2021 1414  ? BUN 10.0 11/21/2012 1245  ? CREATININE 1.06 (H) 05/25/2021 1414  ? CREATININE 0.9 11/21/2012 1245  ?    ?Component Value Date/Time  ? CALCIUM 9.3  05/25/2021 1414  ? CALCIUM 8.7 11/21/2012 1245  ? ALKPHOS 123 05/25/2021 1414  ? ALKPHOS 154 (H) 11/21/2012 1245  ? AST 20 05/25/2021 1414  ? AST 18 11/21/2012 1245  ? ALT 25 05/25/2021 1414  ? ALT 22 11/21/2012 1245  ? BILITOT 0.3 05/25/2021 1414  ? BILITOT 0.26 11/21/2012 1245  ?  ? ? ? ?Impression and Plan: Peggy [name] is a very pleasant 51 yo African American female with history of iron deficiency anemia secondary to malabsorption after having the duodenal switch bariatric surgery in 2005.  ?Iron studies pending.  ?Follow-up in 6 months.  ? ?Lottie Dawson, NP ?3/16/202310:01 AM ? ?

## 2022-02-24 ENCOUNTER — Inpatient Hospital Stay: Payer: No Typology Code available for payment source | Admitting: Family

## 2022-02-24 ENCOUNTER — Telehealth: Payer: Self-pay | Admitting: *Deleted

## 2022-02-24 ENCOUNTER — Inpatient Hospital Stay: Payer: No Typology Code available for payment source

## 2022-02-24 NOTE — Telephone Encounter (Signed)
Per 02/23/22 los - called and lvm of upcoming appointments - requested call back to confirm ?

## 2022-08-25 ENCOUNTER — Ambulatory Visit: Payer: No Typology Code available for payment source | Admitting: Family

## 2022-08-25 ENCOUNTER — Other Ambulatory Visit: Payer: No Typology Code available for payment source

## 2022-09-25 ENCOUNTER — Inpatient Hospital Stay: Payer: No Typology Code available for payment source

## 2022-09-25 ENCOUNTER — Inpatient Hospital Stay: Payer: No Typology Code available for payment source | Admitting: Family

## 2022-10-27 ENCOUNTER — Inpatient Hospital Stay: Payer: No Typology Code available for payment source

## 2022-10-27 ENCOUNTER — Inpatient Hospital Stay: Payer: No Typology Code available for payment source | Admitting: Family

## 2022-12-22 ENCOUNTER — Encounter: Payer: Self-pay | Admitting: Family

## 2022-12-22 ENCOUNTER — Inpatient Hospital Stay: Payer: No Typology Code available for payment source | Attending: Hematology & Oncology

## 2022-12-22 ENCOUNTER — Inpatient Hospital Stay (HOSPITAL_BASED_OUTPATIENT_CLINIC_OR_DEPARTMENT_OTHER): Payer: No Typology Code available for payment source | Admitting: Family

## 2022-12-22 VITALS — BP 115/82 | HR 72 | Temp 97.7°F | Resp 20 | Ht 60.0 in | Wt 261.0 lb

## 2022-12-22 DIAGNOSIS — Z9884 Bariatric surgery status: Secondary | ICD-10-CM | POA: Diagnosis not present

## 2022-12-22 DIAGNOSIS — D508 Other iron deficiency anemias: Secondary | ICD-10-CM

## 2022-12-22 DIAGNOSIS — K909 Intestinal malabsorption, unspecified: Secondary | ICD-10-CM | POA: Insufficient documentation

## 2022-12-22 DIAGNOSIS — D509 Iron deficiency anemia, unspecified: Secondary | ICD-10-CM | POA: Insufficient documentation

## 2022-12-22 LAB — CBC WITH DIFFERENTIAL (CANCER CENTER ONLY)
Abs Immature Granulocytes: 0.01 10*3/uL (ref 0.00–0.07)
Basophils Absolute: 0 10*3/uL (ref 0.0–0.1)
Basophils Relative: 0 %
Eosinophils Absolute: 0.1 10*3/uL (ref 0.0–0.5)
Eosinophils Relative: 2 %
HCT: 38.8 % (ref 36.0–46.0)
Hemoglobin: 12.2 g/dL (ref 12.0–15.0)
Immature Granulocytes: 0 %
Lymphocytes Relative: 33 %
Lymphs Abs: 1.7 10*3/uL (ref 0.7–4.0)
MCH: 26.9 pg (ref 26.0–34.0)
MCHC: 31.4 g/dL (ref 30.0–36.0)
MCV: 85.5 fL (ref 80.0–100.0)
Monocytes Absolute: 0.5 10*3/uL (ref 0.1–1.0)
Monocytes Relative: 10 %
Neutro Abs: 2.8 10*3/uL (ref 1.7–7.7)
Neutrophils Relative %: 55 %
Platelet Count: 331 10*3/uL (ref 150–400)
RBC: 4.54 MIL/uL (ref 3.87–5.11)
RDW: 13.6 % (ref 11.5–15.5)
WBC Count: 5.2 10*3/uL (ref 4.0–10.5)
nRBC: 0 % (ref 0.0–0.2)

## 2022-12-22 LAB — RETICULOCYTES
Immature Retic Fract: 12.2 % (ref 2.3–15.9)
RBC.: 4.47 MIL/uL (ref 3.87–5.11)
Retic Count, Absolute: 65.7 10*3/uL (ref 19.0–186.0)
Retic Ct Pct: 1.5 % (ref 0.4–3.1)

## 2022-12-22 LAB — FERRITIN: Ferritin: 7 ng/mL — ABNORMAL LOW (ref 11–307)

## 2022-12-22 NOTE — Progress Notes (Signed)
Hematology and Oncology Follow Up Visit  Peggy [name] [id number] 03-01-1971 52 y.o. 12/22/2022   Principle Diagnosis:  Iron deficiency anemia secondary to malabsorption, Duodenal Switch bariatric surgery 2005   Current Therapy:        Floradix liquid PO iron daily due to cost of IV iron   Interim History:  Peggy [name] is here today for follow-up. She is doing quite well and has no complaints at this time.  Her cycle has been irregular the last few months not occurring monthly. She has also noted hot flashes and feels she may be perimenopausal.  No bruising or petechiae.  No fever, chills, n/v, cough, rash, dizziness, SOB, chest pain, palpitations, abdominal pain or changes in bowel or bladder habits.  No swelling, tenderness, numbness or tingling in her extremities at this time.  No falls or syncope.  Appetite and hydration are good. Weight is 261 lbs.   ECOG Performance Status: 1 - Symptomatic but completely ambulatory  Medications:  Allergies as of 12/22/2022       Reactions   Norco [hydrocodone-acetaminophen] Nausea And Vomiting        Medication List        Accurate as of December 22, 2022  2:20 PM. If you have any questions, ask your nurse or doctor.          D3-50 1.25 MG (50000 UT) capsule Generic drug: Cholecalciferol TAKE 1 CAPSULE BY MOUTH ONE TIME PER WEEK   PRENATAL VITAMIN PO Take by mouth.   thiamine 50 MG tablet Commonly known as: VITAMIN B-1 TAKE 1 TABLET BY MOUTH EVERY DAY   triamterene-hydrochlorothiazide 37.5-25 MG capsule Commonly known as: DYAZIDE TAKE 1 EACH (1 CAPSULE TOTAL) BY MOUTH DAILY.        Allergies:  Allergies  Allergen Reactions   Norco [Hydrocodone-Acetaminophen] Nausea And Vomiting    Past Medical History, Surgical history, Social history, and Family History were reviewed and updated.  Review of Systems: All other 10 point review of systems is negative.   Physical Exam:  vitals were not taken for this visit.    Wt Readings from Last 3 Encounters:  02/23/22 250 lb (113.4 kg)  07/25/21 249 lb (112.9 kg)  05/25/21 247 lb 6.4 oz (112.2 kg)    Ocular: Sclerae unicteric, pupils equal, round and reactive to light Ear-nose-throat: Oropharynx clear, dentition fair Lymphatic: No cervical or supraclavicular adenopathy Lungs no rales or rhonchi, good excursion bilaterally Heart regular rate and rhythm, no murmur appreciated Abd soft, nontender, positive bowel sounds MSK no focal spinal tenderness, no joint edema Neuro: non-focal, well-oriented, appropriate affect Breasts: Deferred    Lab Results  Component Value Date   WBC 4.7 02/23/2022   HGB 13.0 02/23/2022   HCT 39.8 02/23/2022   MCV 83.1 02/23/2022   PLT 382 02/23/2022   Lab Results  Component Value Date   FERRITIN 28 02/23/2022   IRON 93 02/23/2022   TIBC 337 02/23/2022   UIBC 244 02/23/2022   IRONPCTSAT 28 02/23/2022   Lab Results  Component Value Date   RETICCTPCT 1.6 02/23/2022   RBC 4.79 02/23/2022   No results found for: "KPAFRELGTCHN", "LAMBDASER", "KAPLAMBRATIO" No results found for: "IGGSERUM", "IGA", "IGMSERUM" No results found for: "TOTALPROTELP", "ALBUMINELP", "A1GS", "A2GS", "BETS", "BETA2SER", "GAMS", "MSPIKE", "SPEI"   Chemistry      Component Value Date/Time   NA 139 05/25/2021 1414   NA 142 11/21/2012 1245   K 3.2 (L) 05/25/2021 1414   K 3.7 11/21/2012 1245   CL  104 05/25/2021 1414   CL 109 (H) 11/21/2012 1245   CO2 25 05/25/2021 1414   CO2 26 11/21/2012 1245   BUN 15 05/25/2021 1414   BUN 10.0 11/21/2012 1245   CREATININE 1.06 (H) 05/25/2021 1414   CREATININE 0.9 11/21/2012 1245      Component Value Date/Time   CALCIUM 9.3 05/25/2021 1414   CALCIUM 8.7 11/21/2012 1245   ALKPHOS 123 05/25/2021 1414   ALKPHOS 154 (H) 11/21/2012 1245   AST 20 05/25/2021 1414   AST 18 11/21/2012 1245   ALT 25 05/25/2021 1414   ALT 22 11/21/2012 1245   BILITOT 0.3 05/25/2021 1414   BILITOT 0.26 11/21/2012 1245        Impression and Plan: Peggy [name] is a very pleasant 52 yo African American female with history of iron deficiency anemia secondary to malabsorption after having the duodenal switch bariatric surgery in 2005.  Iron studies pending.  Lab check in 6 months, follow-up in 1 year.   Lottie Dawson, NP 1/12/20242:20 PM

## 2022-12-25 LAB — IRON AND IRON BINDING CAPACITY (CC-WL,HP ONLY)
Iron: 52 ug/dL (ref 28–170)
Saturation Ratios: 13 % (ref 10.4–31.8)
TIBC: 399 ug/dL (ref 250–450)
UIBC: 347 ug/dL (ref 148–442)

## 2023-01-04 ENCOUNTER — Telehealth: Payer: Self-pay | Admitting: *Deleted

## 2023-01-04 NOTE — Telephone Encounter (Signed)
Per scheduling message Judson Roch - called and lvm for a call back to schedule (2) doses of IV Iron.

## 2023-01-18 ENCOUNTER — Telehealth: Payer: Self-pay | Admitting: *Deleted

## 2023-01-18 NOTE — Telephone Encounter (Signed)
Per scheduling message Judson Roch - called and lvm for a call back to schedule (2) doses of IV Iron

## 2023-01-24 ENCOUNTER — Telehealth: Payer: Self-pay | Admitting: *Deleted

## 2023-01-24 NOTE — Telephone Encounter (Signed)
After several attempts reaching out to patient, patient answered phone and explained she is dealing with her father and getting ready to go in hospice. Patient said that she willl call back to schedule the (2) doses of IV Iron once she is back in [place].

## 2023-06-27 ENCOUNTER — Inpatient Hospital Stay: Payer: No Typology Code available for payment source

## 2023-12-24 ENCOUNTER — Inpatient Hospital Stay: Payer: No Typology Code available for payment source

## 2023-12-24 ENCOUNTER — Inpatient Hospital Stay: Payer: No Typology Code available for payment source | Admitting: Family

## 2024-12-29 ENCOUNTER — Ambulatory Visit: Admitting: Urgent Care
